# Patient Record
Sex: Male | Born: 1960 | Race: White | Hispanic: No | Marital: Single | Smoking: Current every day smoker
Health system: Southern US, Community
[De-identification: ages and names within clinical notes are randomized; demographics above are authoritative.]

## PROBLEM LIST (undated history)

## (undated) DIAGNOSIS — B1920 Unspecified viral hepatitis C without hepatic coma: Secondary | ICD-10-CM

## (undated) DIAGNOSIS — N200 Calculus of kidney: Secondary | ICD-10-CM

## (undated) DIAGNOSIS — F209 Schizophrenia, unspecified: Secondary | ICD-10-CM

## (undated) DIAGNOSIS — K8020 Calculus of gallbladder without cholecystitis without obstruction: Secondary | ICD-10-CM

## (undated) DIAGNOSIS — F3160 Bipolar disorder, current episode mixed, unspecified: Secondary | ICD-10-CM

## (undated) HISTORY — DX: Bipolar disorder, current episode mixed, unspecified: F31.60

## (undated) HISTORY — DX: Calculus of kidney: N20.0

## (undated) HISTORY — DX: Unspecified viral hepatitis C without hepatic coma: B19.20

## (undated) HISTORY — DX: Schizophrenia, unspecified: F20.9

## (undated) HISTORY — DX: Calculus of gallbladder without cholecystitis without obstruction: K80.20

---

## 2014-09-14 ENCOUNTER — Emergency Department
Admission: EM | Admit: 2014-09-14 | Discharge: 2014-09-15 | Disposition: A | Discharge status: Home / Self Care | Payer: No Typology Code available for payment source | Attending: Emergency Medicine | Admitting: Emergency Medicine

## 2014-09-14 ENCOUNTER — Emergency Department: Payer: No Typology Code available for payment source

## 2014-09-14 DIAGNOSIS — K2970 Gastritis, unspecified, without bleeding: Secondary | ICD-10-CM | POA: Insufficient documentation

## 2014-09-14 DIAGNOSIS — R319 Hematuria, unspecified: Secondary | ICD-10-CM | POA: Insufficient documentation

## 2014-09-14 DIAGNOSIS — M545 Low back pain, unspecified: Secondary | ICD-10-CM

## 2014-09-14 DIAGNOSIS — N200 Calculus of kidney: Secondary | ICD-10-CM | POA: Insufficient documentation

## 2014-09-14 DIAGNOSIS — R109 Unspecified abdominal pain: Secondary | ICD-10-CM | POA: Insufficient documentation

## 2014-09-14 DIAGNOSIS — K8020 Calculus of gallbladder without cholecystitis without obstruction: Secondary | ICD-10-CM | POA: Insufficient documentation

## 2014-09-14 LAB — VH URINALYSIS WITH MICROSCOPIC
Bilirubin, UA: NEGATIVE mg/dL
Glucose, UA: NEGATIVE mg/dL
Ketones UA: NEGATIVE mg/dL
Leukocyte Esterase, UA: NEGATIVE Leu/uL
Nitrite, UA: NEGATIVE
Protein, UR: NEGATIVE mg/dL
Urine Specific Gravity: 1.02 (ref 1.001–1.040)
Urobilinogen, UA: 2 mg/dL — AB
WBC, UA: NONE SEEN /hpf
pH, Urine: 7 pH (ref 5.0–8.0)

## 2014-09-14 LAB — COMPREHENSIVE METABOLIC PANEL
ALT: 245 U/L — ABNORMAL HIGH (ref 0–55)
AST (SGOT): 140 U/L — ABNORMAL HIGH (ref 10–42)
Albumin/Globulin Ratio: 0.9 Ratio (ref 0.70–1.50)
Albumin: 3.8 gm/dL (ref 3.5–5.0)
Alkaline Phosphatase: 73 U/L (ref 40–145)
Anion Gap: 13.3 mMol/L (ref 7.0–18.0)
BUN / Creatinine Ratio: 16.5 Ratio (ref 10.0–30.0)
BUN: 14 mg/dL (ref 7–22)
Bilirubin, Total: 0.6 mg/dL (ref 0.1–1.2)
CO2: 24 mMol/L (ref 20.0–30.0)
Calcium: 9.7 mg/dL (ref 8.5–10.5)
Chloride: 108 mMol/L (ref 98–110)
Creatinine: 0.85 mg/dL (ref 0.80–1.30)
EGFR: >60 mL/min/{1.73_m2}
Globulin: 4.3 gm/dL — ABNORMAL HIGH (ref 2.0–4.0)
Glucose: 90 mg/dL (ref 70–99)
Osmolality Calc: 280 mOsm/kg (ref 275–300)
Potassium: 4.9 mMol/L (ref 3.5–5.3)
Protein, Total: 8.1 gm/dL (ref 6.0–8.3)
Sodium: 140 mMol/L (ref 136–147)

## 2014-09-14 LAB — CBC AND DIFFERENTIAL
Basophils %: 1 % (ref 0.0–3.0)
Basophils Absolute: 0 10*3/uL (ref 0.0–0.3)
Eosinophils %: 3.7 % (ref 0.0–7.0)
Eosinophils Absolute: 0.1 10*3/uL (ref 0.0–0.8)
Hematocrit: 39.9 % (ref 39.0–52.5)
Hemoglobin: 14.2 gm/dL (ref 13.0–17.5)
Lymphocytes Absolute: 1 10*3/uL (ref 0.6–5.1)
Lymphocytes: 27.1 % (ref 15.0–46.0)
MCH: 36 pg — ABNORMAL HIGH (ref 28–35)
MCHC: 36 gm/dL (ref 31–36)
MCV: 101 fL — ABNORMAL HIGH (ref 80–100)
MPV: 7 fL (ref 6.0–10.0)
Monocytes Absolute: 0.5 10*3/uL (ref 0.1–1.7)
Monocytes: 12.9 % (ref 3.0–15.0)
Neutrophils %: 55.3 % (ref 42.0–78.0)
Neutrophils Absolute: 2.1 10*3/uL (ref 1.7–8.6)
PLT CT: 119 10*3/uL — ABNORMAL LOW (ref 130–440)
RBC: 3.97 10*6/uL — ABNORMAL LOW (ref 4.00–5.70)
RDW: 11.3 % (ref 10.5–14.5)
WBC: 3.8 10*3/uL — ABNORMAL LOW (ref 4.00–11.00)

## 2014-09-14 LAB — LIPASE: Lipase: 24 U/L (ref 8–78)

## 2014-09-14 MED ORDER — FAMOTIDINE 20 MG PO TABS
ORAL_TABLET | ORAL | Status: AC
Start: 2014-09-14 — End: ?
  Filled 2014-09-14: qty 1

## 2014-09-14 MED ORDER — IOPAMIDOL 61 % IV SOLN
100.0000 mL | Freq: Once | INTRAVENOUS | Status: AC
Start: 2014-09-14 — End: 2014-09-14
  Administered 2014-09-14: 100 mL via INTRAVENOUS

## 2014-09-14 MED ORDER — OXYCODONE HCL 5 MG PO TABS
5.0000 mg | ORAL_TABLET | ORAL | Status: DC | PRN
Start: 2014-09-14 — End: 2017-05-09

## 2014-09-14 MED ORDER — MORPHINE SULFATE 4 MG/ML IJ/IV SOLN (WRAP)
4.0000 mg | Freq: Once | Status: AC
Start: 2014-09-14 — End: 2014-09-14
  Administered 2014-09-14: 4 mg via INTRAVENOUS

## 2014-09-14 MED ORDER — FAMOTIDINE 20 MG PO TABS
20.0000 mg | ORAL_TABLET | Freq: Once | ORAL | Status: AC
Start: 2014-09-14 — End: 2014-09-14
  Administered 2014-09-14: 20 mg via ORAL

## 2014-09-14 MED ORDER — SODIUM CHLORIDE 0.9 % IV SOLN
INTRAVENOUS | Status: DC
Start: 2014-09-14 — End: 2014-09-15

## 2014-09-14 MED ORDER — MORPHINE SULFATE (PF) 4 MG/ML IV SOLN
INTRAVENOUS | Status: AC
Start: 2014-09-14 — End: ?
  Filled 2014-09-14: qty 1

## 2014-09-14 NOTE — ED Notes (Signed)
Called Marvin Khan in lab and they are aware of added urinalysis.

## 2014-09-14 NOTE — ED Provider Notes (Signed)
Physician/Midlevel provider first contact with patient: 09/14/14 2111         [facility] EMERGENCY DEPARTMENT History and Physical Exam      Patient Name: Marvin, Khan  Encounter Date:  09/14/2014  Attending Physician: Justice Britain, MD  PCP: Christa See, MD  Patient DOB:  11/09/1960  MRN:  [id number]  Room:  E3/ED3-A      History of Presenting Illness     Chief complaint: Back Pain    HPI/ROS is limited by: none  HPI/ROS given by: patient    Location: mid low back  Duration: since yesterday  Severity: moderate    [name] is a 53 y.o. male who presents with mid low back pain and mid abdominal pain.  Pt notes he has back pain off and on.  He notes he just had upper and lower endoscopy, was told he need 3 medications for an infection in his stomach but cant afford rx.  He states they told him in an ER he had cirrhosis but then the specialist told him he doesn't have it.  He does state he has hep C.  He notes h/o gallstones and h/o kidney stones      Review of Systems     Review of Systems   Constitutional: Negative for fever.   Cardiovascular: Negative for chest pain.   Gastrointestinal: Positive for abdominal pain.   Genitourinary: Negative for dysuria.   Musculoskeletal: Positive for back pain.   Neurological: Negative for focal weakness.   All other systems reviewed and are negative.       Allergies     Pt is allergic to toradol.    Medications     Current Outpatient Rx   Name  Route  Sig  Dispense  Refill   . oxyCODONE (ROXICODONE) 5 MG immediate release tablet    Oral    Take 1-2 tablets (5-10 mg total) by mouth every 4 (four) hours as needed for Pain.    12 tablet    0     . QUEtiapine (SEROQUEL) 200 MG tablet    Oral    Take 200 mg by mouth 2 (two) times daily.                  Past Medical History     Pt has a past medical history of Calculus of kidney; Gallstones; Bipolar 1 disorder, mixed; Schizophrenia; and Hepatitis C.    Past Surgical History     Pt has past surgical history that includes  RHINOPLASTY, SEPTOPLASTY, LEVEL 3 (MEDICAL).    Family History     The family history is not on file.    Social History     Pt reports that he has been smoking Cigarettes.  He has been smoking about 1.00 pack per day. He has never used smokeless tobacco. He reports that he drinks about 7.2 oz of alcohol per week. He reports that he uses illicit drugs (Marijuana).    Physical Exam     Blood pressure 158/110, pulse 72, temperature 98.3 F (36.8 C), temperature source Oral, resp. rate 20, height 1.702 m, weight 94.348 kg, SpO2 98 %.    Constitutional: Vital signs reviewed. Well appearing.  Head: Normocephalic, atraumatic  Eyes: Conjunctiva and sclera are normal.  No injection or discharge.  Ears, Nose, Throat:  Normal external examination of the nose and ears.    Neck: Normal range of motion. Trachea midline.  Respiratory/Chest: Clear to auscultation. No respiratory distress.   Cardiovascular: Regular  rate and rhythm.  Abdomen:  No rebound or guarding. Soft.  Somewhat distended, no tympany.  Mild mid and epigastric TTP  Back: no cva tenderness.  No lumbar TTP   Upper Extremity:  No edema. No cyanosis.  Lower Extremity:  No edema. No cyanosis.  Skin: Warm and dry. No rash.  Psychiatric:  Normal affect.  Normal insight  Neuro: alert and oriented     Orders Placed     Orders Placed This Encounter   Procedures   . CT Abdomen Pelvis with IV Cont   . CMP   . CBC   . Lipase   . Urinalysis with Microscopic   . Cardiac Monitoring (Hard Wire)   . Saline lock IV       Diagnostic Results       The results of the diagnostic studies below have been reviewed by myself:    Labs  Results     Procedure Component Value Units Date/Time    Urinalysis with Microscopic [[id number]]  (Abnormal) Collected:  09/14/14 2130    Specimen Information:  Urine, Random Updated:  09/14/14 2315     Color, UA Yellow      Clarity, UA Clear      Specific Gravity, UR 1.020      pH, Urine 7.0 pH      Protein, UR Negative mg/dL      Glucose, UA Negative mg/dL       Ketones UA Negative mg/dL      Bilirubin, UA Negative mg/dL      Blood, UA Large (A) mg/dL      Nitrite, UA Negative      Urobilinogen, UA 2.0 (A) mg/dL      Leukocyte Esterase, UA Negative Leu/uL      UR Micro Performed      WBC, UA None Seen /hpf      RBC, UA 20-30 (A) /hpf      Bacteria, UA None /hpf     CMP [[id number]]  (Abnormal) Collected:  09/14/14 2155    Specimen Information:  Plasma Updated:  09/14/14 2227     Sodium 140 mMol/L      Potassium 4.9 mMol/L      Chloride 108 mMol/L      CO2 24.0 mMol/L      Calcium 9.7 mg/dL      Glucose 90 mg/dL      Creatinine 9.32 mg/dL      BUN 14 mg/dL      Protein, Total 8.1 gm/dL      Albumin 3.8 gm/dL      Alkaline Phosphatase 73 U/L      ALT 245 (H) U/L      AST (SGOT) 140 (H) U/L      Bilirubin, Total 0.6 mg/dL      Albumin/Globulin Ratio 0.90 Ratio      Anion Gap 13.3 mMol/L      BUN/Creatinine Ratio 16.5 Ratio      EGFR >60 mL/min/1.87m2      Osmolality Calc 280 mOsm/kg      Globulin 4.3 (H) gm/dL     Lipase [[id number]] Collected:  09/14/14 2155    Specimen Information:  Plasma Updated:  09/14/14 2227     Lipase 24 U/L     CBC [[id number]]  (Abnormal) Collected:  09/14/14 2155    Specimen Information:  Blood from Blood Updated:  09/14/14 2222     WBC 3.8 (L) K/cmm      RBC 3.97 (L) M/cmm  Hemoglobin 14.2 gm/dL      Hematocrit 91.4 %      MCV 101 (H) fL      MCH 36 (H) pg      MCHC 36 gm/dL      RDW 78.2 %      PLT CT 119 (L) K/cmm      MPV 7.0 fL      NEUTROPHIL % 55.3 %      Lymphocytes 27.1 %      Monocytes 12.9 %      Eosinophils % 3.7 %      Basophils % 1.0 %      Neutrophils Absolute 2.1 K/cmm      Lymphocytes Absolute 1.0 K/cmm      Monocytes Absolute 0.5 K/cmm      Eosinophils Absolute 0.1 K/cmm      BASO Absolute 0.0 K/cmm           Radiologic Studies  Radiology Results (24 Hour)     Procedure Component Value Units Date/Time    CT Abdomen Pelvis with IV Cont [[id number]] Collected:  09/14/14 2250    Order Status:  Completed Updated:  09/14/14 2258     Narrative:      Clinical History:  Abdominal and back pain with history of kidney stones and cholelithiasis.    Technique:  CT of abdomen and pelvis with intravenous contrast obtained. Multiplanar reconstructions performed.  CT images were acquired utilizing Automated Exposure Control for dose reduction.     Contrast:  100 cc of Isovue-300    Comparison:  None available.    Findings:    Lower chest:  The lung bases are clear. No pleural effusions.    Abdomen/Pelvis:    Nodular cirrhotic liver morphology. No focal hepatic lesion seen. Nondistended gallbladder with radiopaque  cholelithiasis at the neck region evident. No biliary dilatation.    The spleen, pancreas, and adrenal glands are unremarkable. Mildly prominent portohepatic and portacaval lymph nodes are  presumably reactive to hepatic disease.    Corticomedullary phase of renal enhancement limits evaluation. There is mild lobulated contour scarring. A couple tiny  right renal hypodense lesions are too small to definitively characterize but probably cysts.    Nonobstructing left nephrolithiasis with a cluster of the largest stones in the lower pole measuring about 4 mm. There  are subtle punctate nonobstructing right renal calculi to a lesser extent. No hydronephrosis or distal urolithiasis.    Normal course and caliber of small and large bowel without obstruction or acute inflammation. Normal appendix.  No pneumoperitoneum.    The urinary bladder is unremarkable without wall thickening, calcification, or intraluminal calculus.     Normal prostate size. No aggressive osseous lesions.      Impression:        1. Nephrolithiasis without evidence of obstruction. No hydronephrosis.    2. Cholelithiasis. No evidence of acute cholecystitis or biliary dilatation.    3. Nodular cirrhotic liver morphology. Correlate with LFTs.          ReadingStation:[id number]          EKG: none      MDM / Critical Care     Blood pressure 158/110, pulse 72, temperature 98.3 F (36.8 C),  temperature source Oral, resp. rate 20, height 1.702 m, weight 94.348 kg, SpO2 98 %.    DDX includes aortic dissection, gastritis and msk low back pain, DDD, cholecystitis among others.    ED Course     CT viewed by myself  only notable for known existing pathology.  Findings and hematuria and f/u d/w pt.  Pt encouraged to fill rx from his gastroenterologist, provided number for social worker.  If not able to accomplish that pt encouraged to take PPI or pepcid.  Standard and appropriate return to ED precautions given to which patient verbalized understanding.  All questions answered and concerns addressed.       Procedures         Diagnosis / Disposition     Clinical Impression  1. Gastritis    2. Calculus of gallbladder without cholecystitis without obstruction    3. Nephrolithiasis    4. Hematuria    5. Midline low back pain without sciatica        Disposition  ED Disposition     Discharge Louise Gergely discharge to home/self care.    Condition at disposition: Stable            Prescriptions  Discharge Medication List as of 09/15/2014 12:05 AM      START taking these medications    Details   oxyCODONE (ROXICODONE) 5 MG immediate release tablet Take 1-2 tablets (5-10 mg total) by mouth every 4 (four) hours as needed for Pain., Starting 09/14/2014, Until Discontinued, Print                         Justice Britain, MD  09/15/14 [phone]
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
## 2014-09-15 NOTE — Discharge Instructions (Signed)
Gastritis (Adult)    Gastritis is an irritation of the stomach lining. It can be acute (recent) or chronic (lasting a long time). Gastritis can be caused by overuse of alcohol or anti-inflammatory medications (such as aspirin, ibuprofen, or prednisone). H pyloriinfection can also cause chronic gastritis.  Gastritis can cause a dull ache or burning pain in the upper abdomen. Other symptoms include nausea, vomiting, loss of appetite, and belching or bloating. Blood in the vomit or stools (red or black) is a sign of bleeding in the stomach. This requires immediate medical attention.  Tests for H pyloriare used to screen for bacterial infection. If no infection is found, gastritis can be treated by stopping the cause and treating with antacids plus an acid blocker medication. If H pylori infection is found, antibiotics will also be prescribed. Persons 55 years and older may undergo other tests before treatment is started.  Two common tests are used to evaluate your symptoms. An upper GI series is an x-ray taken after you drink a chalky liquid called barium. This coats the stomach and allows the doctor to view any problems in the stomach on the x-ray. Another test is called endoscopy, during which a long thin tube called an endoscope is passed down your throat to the stomach. A camera at the end of the scope allows the doctor to view inside the stomach to check the cause of your symptoms.  Home Care:   Take the prescribed acid blocker medication for the full course of treatment even if you begin to feel better sooner. This medication can take up to several days to fully control your symptoms. If you can't afford the prescribed medication, you can try over-the-counter acid blockers, such as Pepcid AC, Tagamet, Zantac, or Aciphex. If these do not relieve your symptoms, a stronger acid-blocker can be tried, such as Prilosec OTC.   If you have been prescribed an antibiotic to treat H pyloriinfection, finish the full  course of medication. Do so even if you begin to feel better sooner. If you stop the medication too soon, the infection can return and be harder to treat.   You can use antacids, such as Tums, Rolaids, Mylanta, or Maalox, for pain. This will be useful the first few days after starting acid blockers when the blockers haven't started working yet. Follow the directions on the label. Liquid antacids may work better than tablets. Note that antacids can interfere with absorption of certain medications. Specifically, do not take Tagamet (cimetidine), Zantac (ranitidine), or Carafate (sucralfate) within 1 hour of taking an antacid. Talk with your pharmacist if you have any questions.   Symptoms of gastritis can be worsened by certain foods. Limit or avoid fatty, fried, and spicy foods, as well as coffee, chocolate, mint, and foods with high acid content such as tomatoes and citrus fruit and juices (orange, grapefruit, lemon).   Avoid alcohol, caffeine, and tobacco, which can delay healing.   Avoid aspirin and anti-inflammatory medications such as ibuprofen (Advil, Motrin) and naproxen (Naprosyn, Aleve). Acetaminophen (Tylenol) is safe to use. Do not take more than the amount listed on the label.  Follow Up  with your doctor, or as advised by our staff. Further testing may be needed. If you do not improve over the next 4 days, contact your doctor. If you had an x-ray, CT scan, or ECG (electrocardiogram), it will be reviewed by a specialist. You'll be notified of any new findings that affect your care.  Get Prompt Medical Attention  if   any of the following occur:   Stomach pain gets worse or moves to the lower right abdomen (appendix area)   Chest pain appears or gets worse, or spreads to the back, neck, shoulder, or arm   Frequent vomiting (can't keep down liquids)   Blood in the stool or vomit (red or black in color)   Feeling weak or dizzy, fainting, or trouble breathing   Fever of 100.4F (38C) or higher, or as  directed by your healthcare provider   2000-2015 [employer] [address], [place] [address]. All rights reserved. This information is not intended as a substitute for professional medical care. Always follow your healthcare professional's instructions.

## 2020-05-17 IMAGING — MR MRI ABDOMEN WO/W CONTRAST
6 of 12 series · 15 of 48 positions shown · IV contrast (20cc prohance)
Comparison: none

[Series 2: bSSFP · axial · 8.0mm · 1.76mm/px · 1 of 22 slices shown]
[im 1/22]
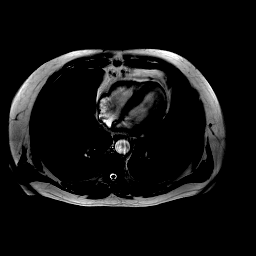

[Series 3: t2_cor_haste · coronal · 8.0mm · 0.84mm/px · 1 of 24 slices shown]
[im 1/24]
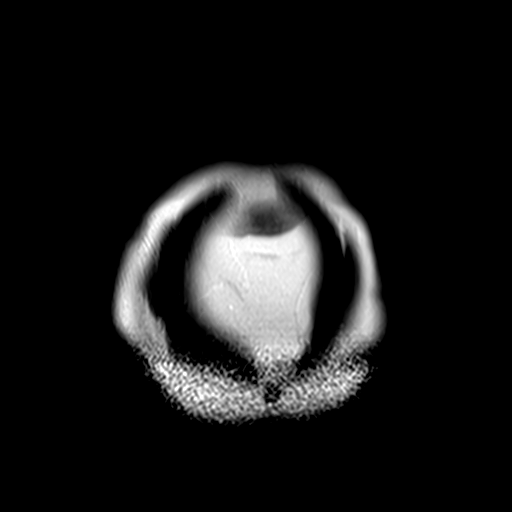

[Series 4: t2_axial_fs · axial · 7.0mm · 1.76mm/px · 1 of 28 slices shown]
[im 1/28]
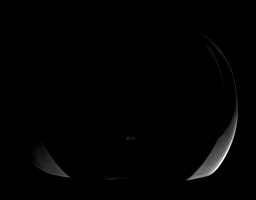

[Series 5: t1_vibe_(person_name)_axial_fs_in · axial · 4.0mm · 0.70mm/px · z∈[-187,+49]mm · 5 of 60 slices shown]
[im 1/60]
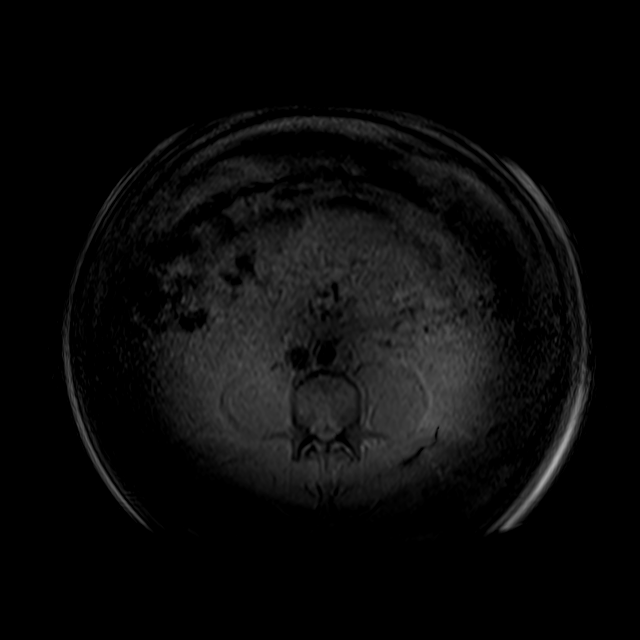
[im 15/60]
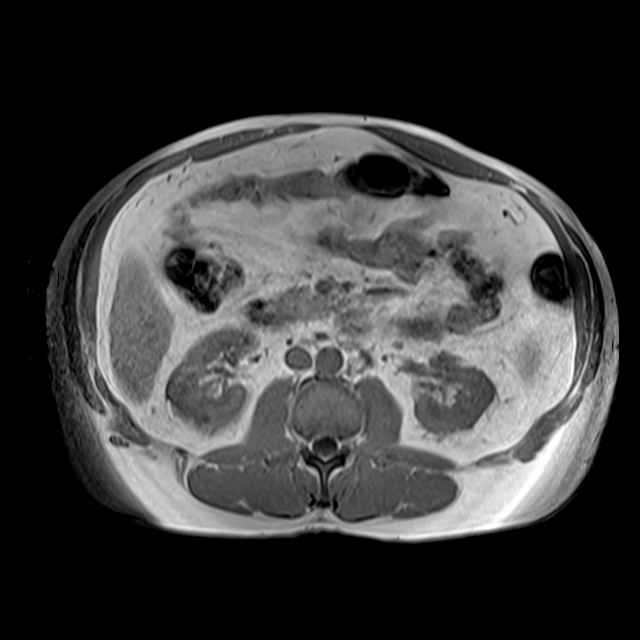
[im 30/60]
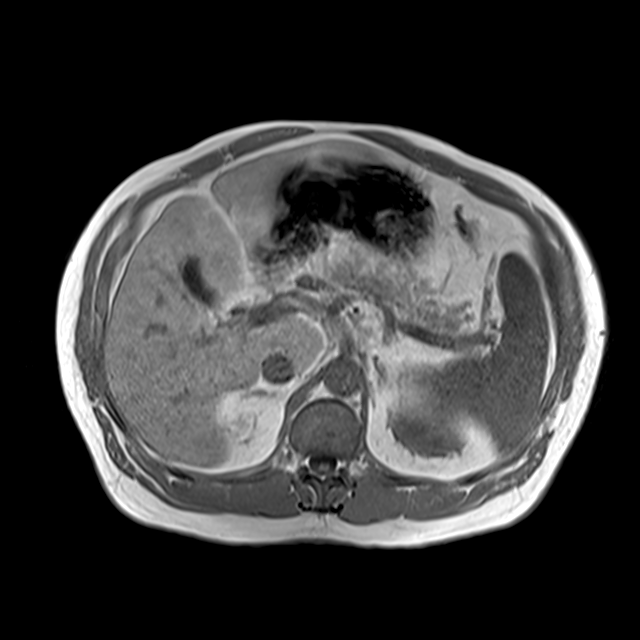
[im 45/60]
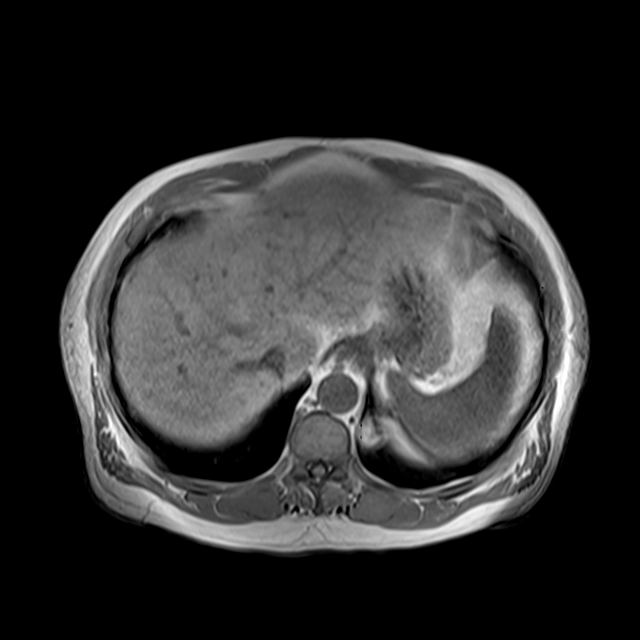
[im 60/60]
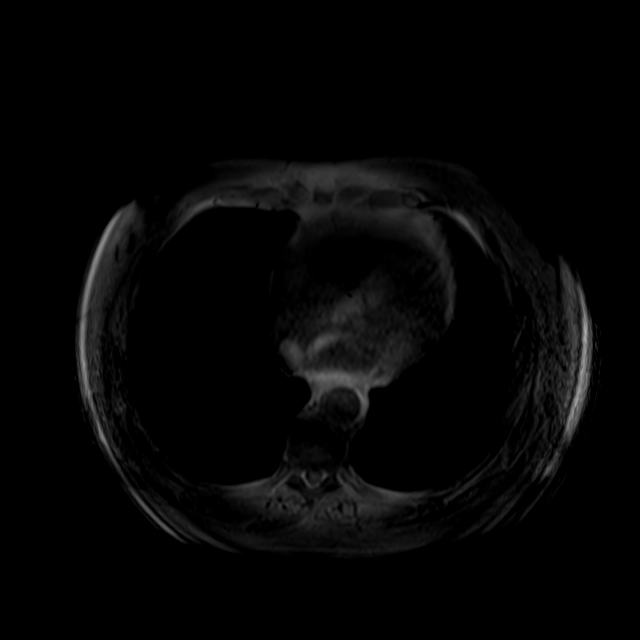

[Series 6: t1_vibe_(person_name)_axial_fs_opp · axial · 4.0mm · 0.70mm/px · z∈[-187,+49]mm · 5 of 60 slices shown]
[im 1/60]
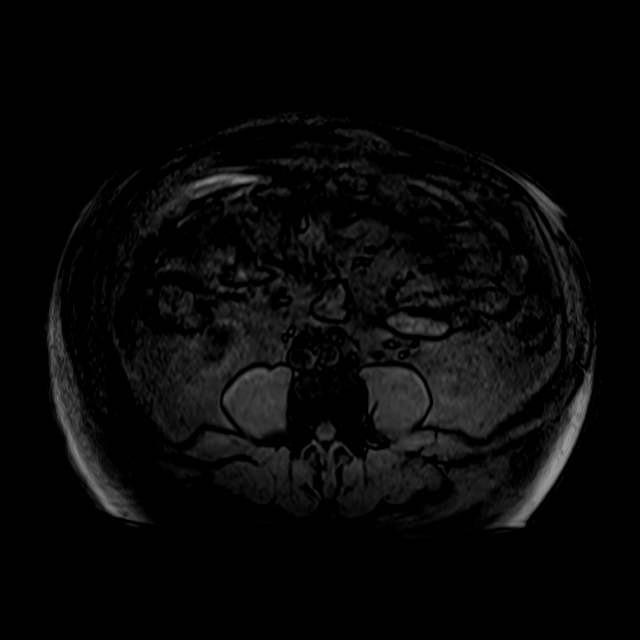
[im 15/60]
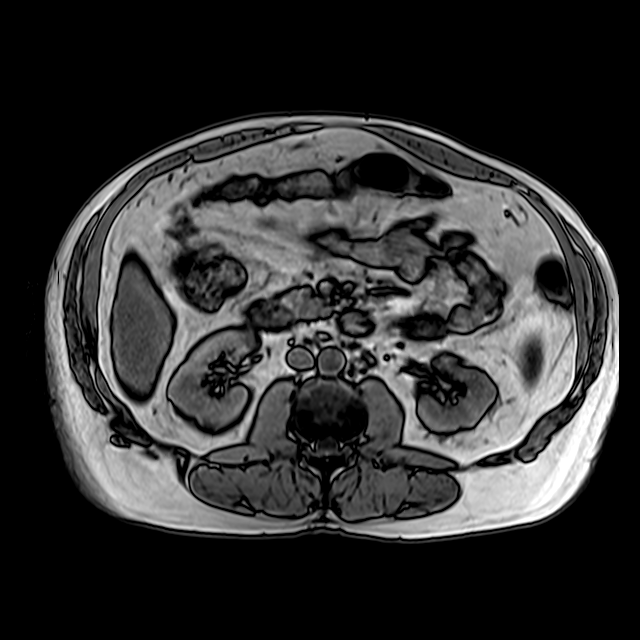
[im 30/60]
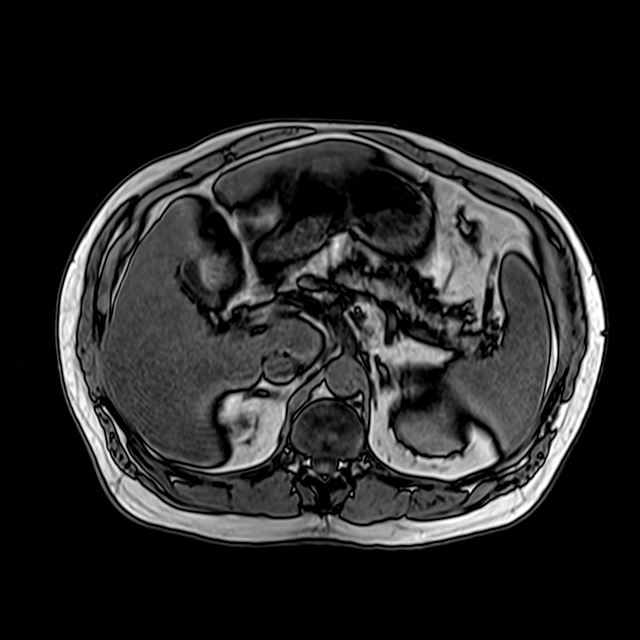
[im 45/60]
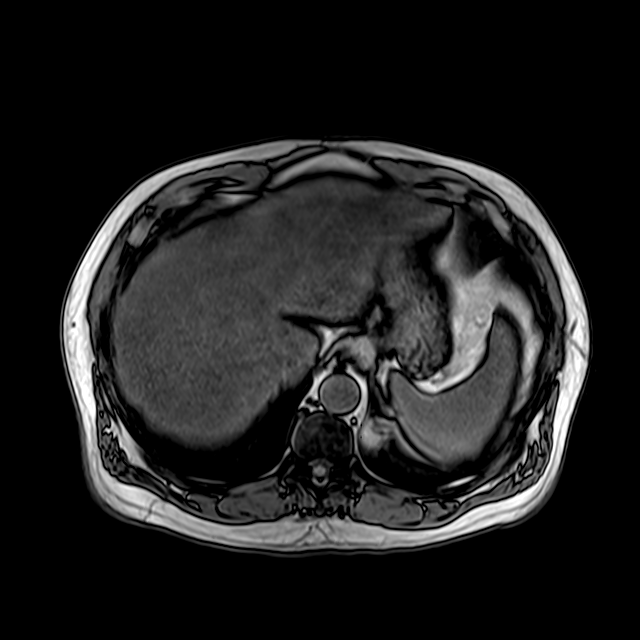
[im 60/60]
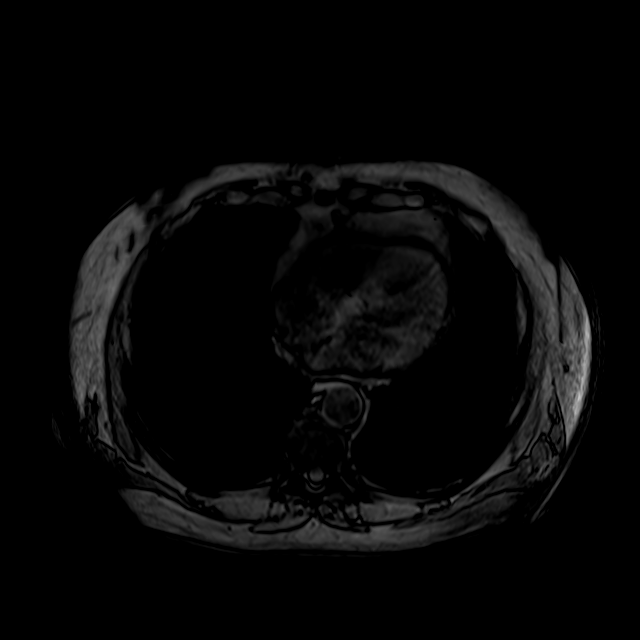

[Series 8: t1_vibe_(person_name)_axial_fs_w · axial · 4.0mm · 0.70mm/px · z∈[-187,-131]mm · 2 of 60 slices shown]
[im 1/60]
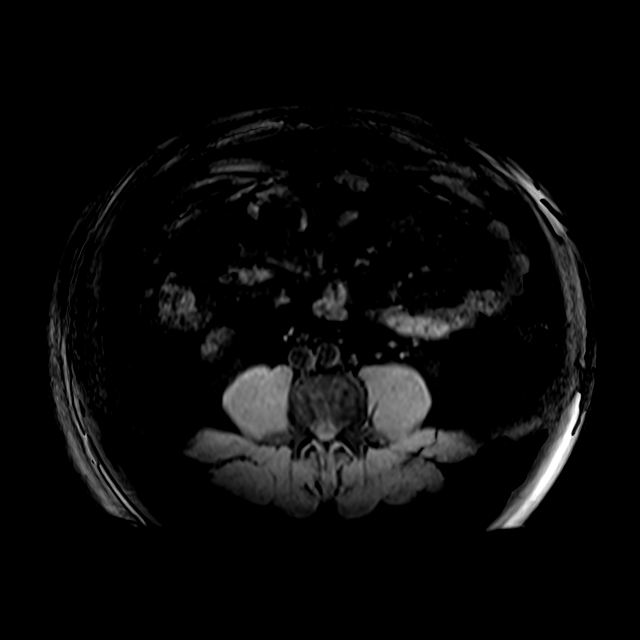
[im 15/60]
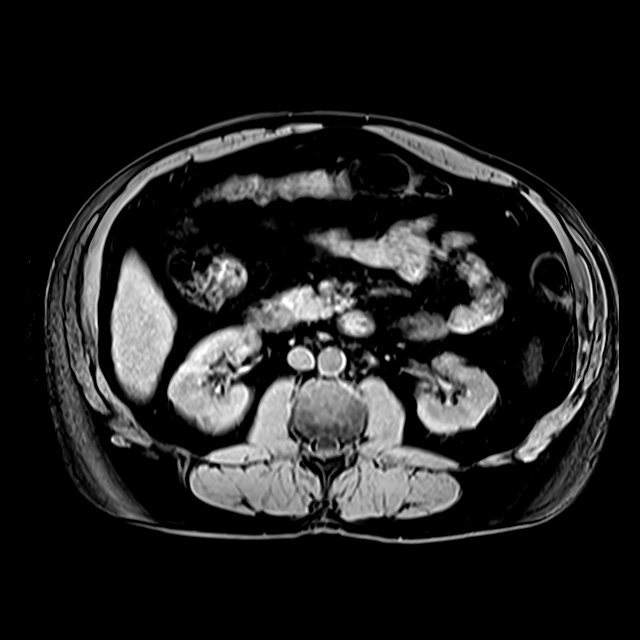

[15 of 48 positions shown; findings below may reference images not displayed]

REASON FOR EXAM

*
Cirrhosis of liver, history of liver masses

COMPARISON

*
None

TECHNIQUE

*
MR images of the abdomen were acquired before and after the administration of 20 mL ProHance IV contrast

FINDINGS

Liver Observation #1

*
Subcapsular segment V   ([DATE])

*
1.2 cm (not well-defined)

*
Subtle arterial hyperenhancement (subtracted images)

*
Negative for washout

*
Lack of enhancing capsule

*
Fat sparing

*
LR-3

Liver

*
Subtle nodularity

*
Steatosis

*
Minimal subcapsular shunting about the liver on the arterial phase

Vasculature

*
Standard hepatic arterial anatomy

*
Patent hepatic veins

*
Patent portal vein, splenic vein, SMV

Biliary System

*
Contracted gallbladder

*
Cholelithiasis

*
No biliary dilatation

Extrahepatic Manifestations of Cirrhosis

*
Tiny collateral veins

*
No ascites

*
Mild splenomegaly

Additional

*
Punctate renal cysts

*
No suspicious lymph nodes

*
No malignant osseous enhancement

IMPRESSION

*
Hepatic segment V lesion is intermediate in probability of malignancy (LR-3). Consider follow-up MR abdomen (without and with IV contrast) in 3-6 months

*
Cirrhosis

*
Cholelithiasis

## 2020-09-06 IMAGING — MR MRI ABDOMEN WO/W CONTRAST
5 of 9 series · 17 of 48 positions shown · IV contrast (20cc ProHance)
Comparison: none

[Series 1: bSSFP · axial · 8.0mm · 1.76mm/px · 1 of 22 slices shown]
[im 1/22]
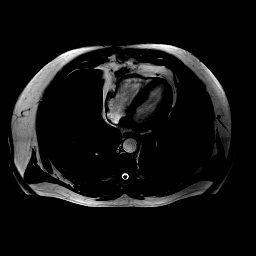

[Series 2: t2_cor_haste · coronal · 6.0mm · 0.73mm/px · 2 of 24 slices shown]
[im 1/24]
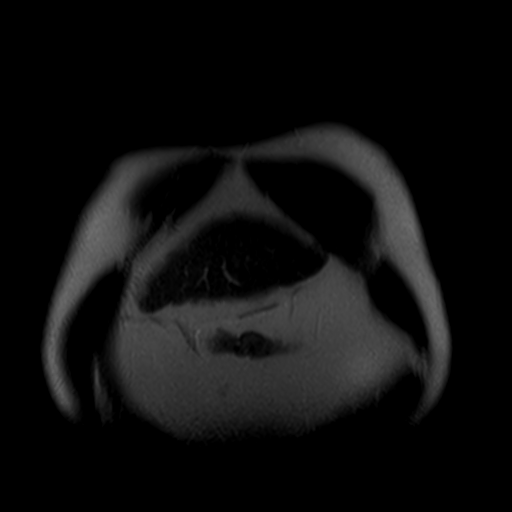
[im 24/24]
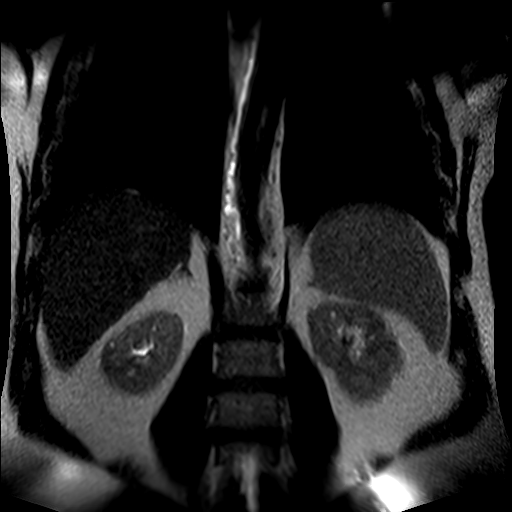

[Series 3: t2_axial_fs · axial · 6.0mm · 1.64mm/px · z∈[-163,+46]mm · 3 of 30 slices shown]
[im 1/30]
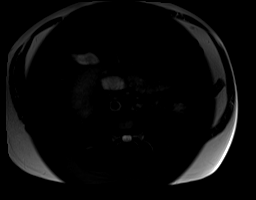
[im 15/30]
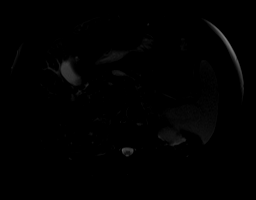
[im 30/30]
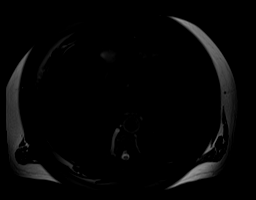

[Series 4: t1_vibe_(person_name)_axial_fs_in · axial · 3.3mm · 0.59mm/px · z∈[-163,+45]mm · 7 of 64 slices shown]
[im 1/64]
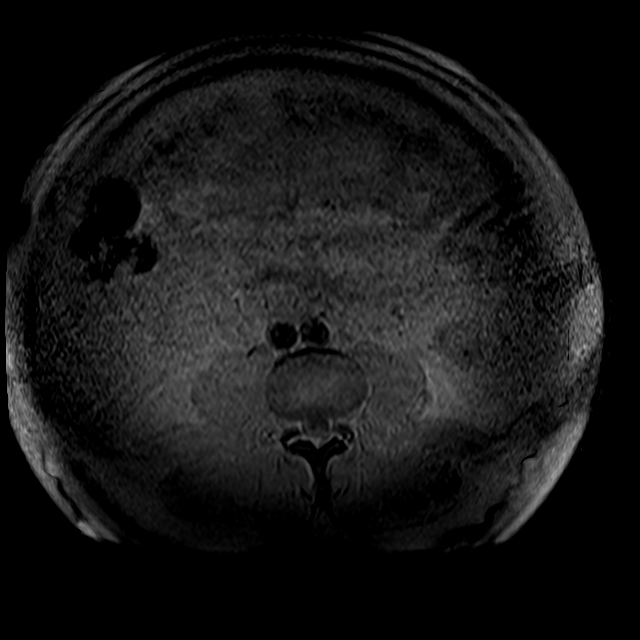
[im 11/64]
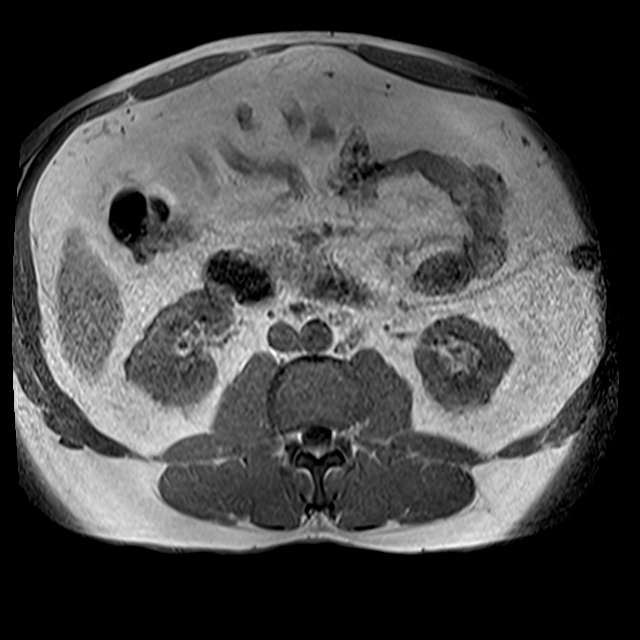
[im 22/64]
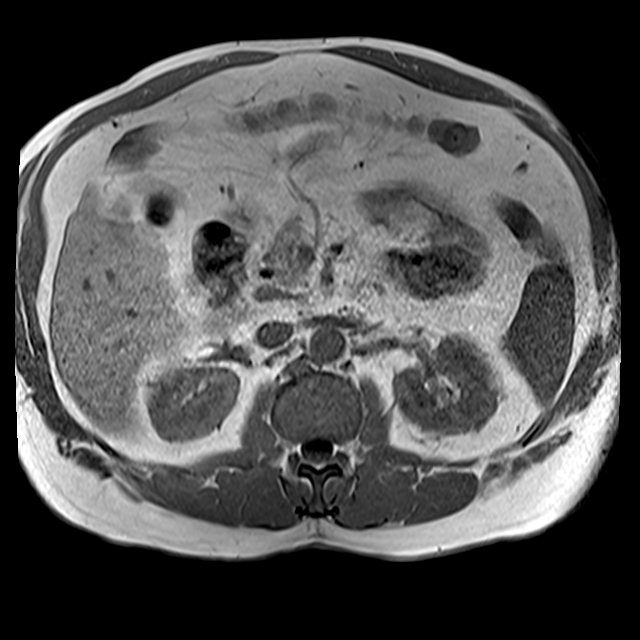
[im 32/64]
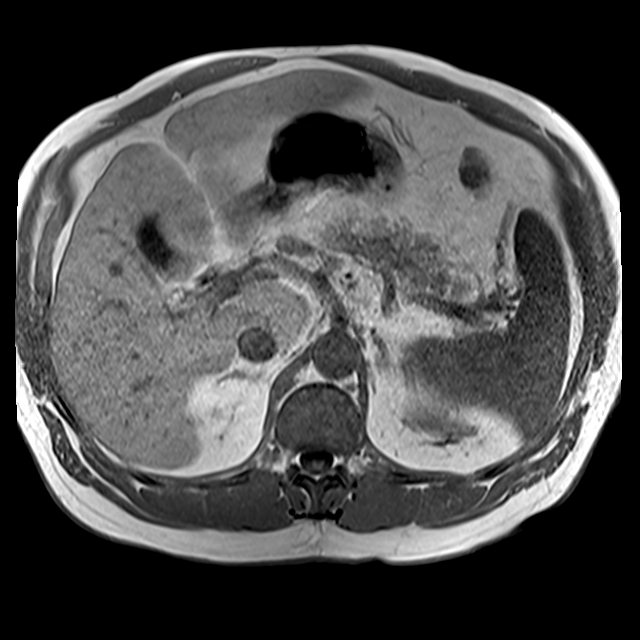
[im 43/64]
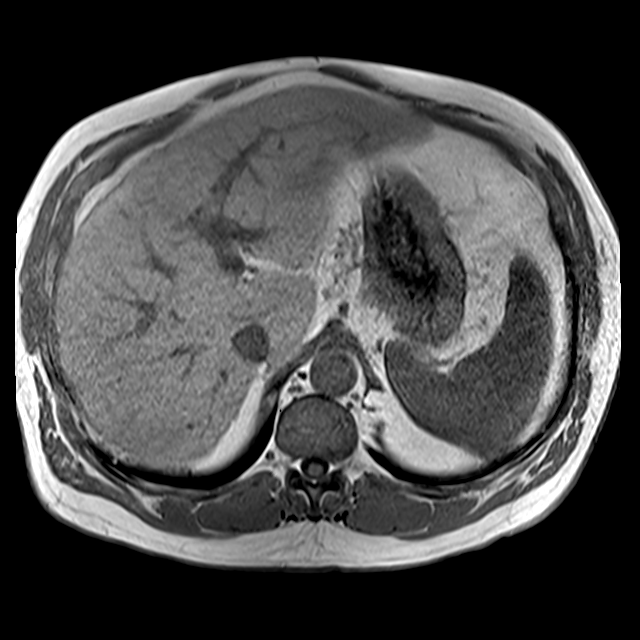
[im 53/64]
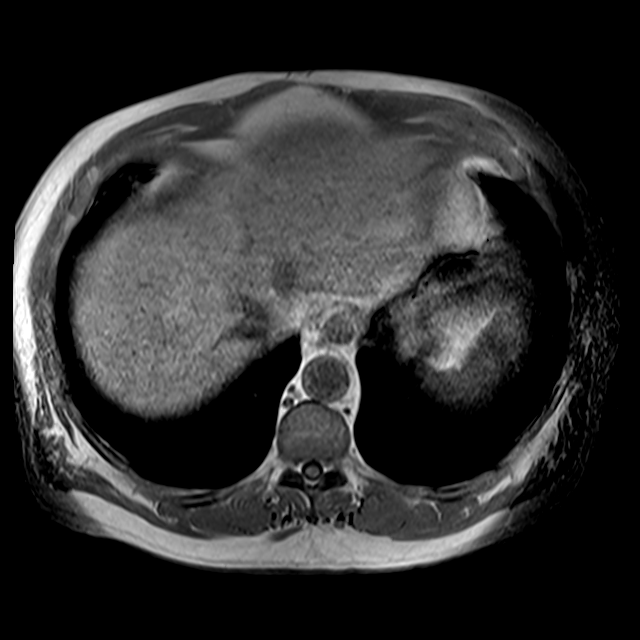
[im 64/64]
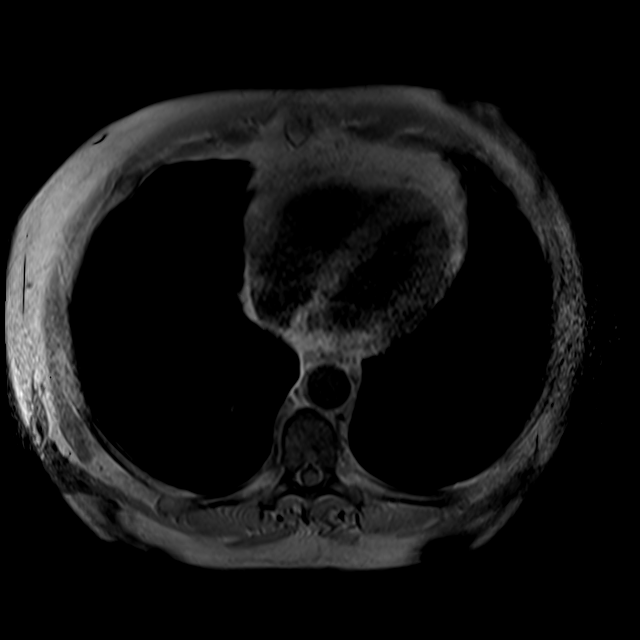

[Series 5: t1_vibe_(person_name)_axial_fs_opp · axial · 3.3mm · 0.59mm/px · z∈[-163,-60]mm · 4 of 64 slices shown]
[im 1/64]
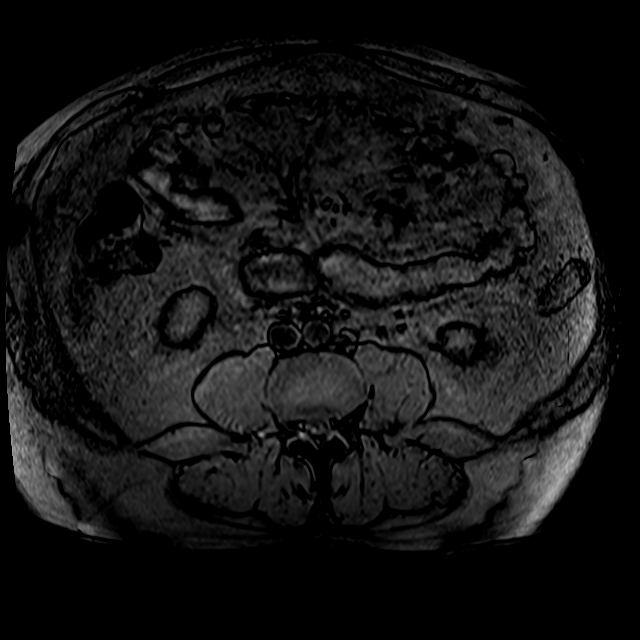
[im 11/64]
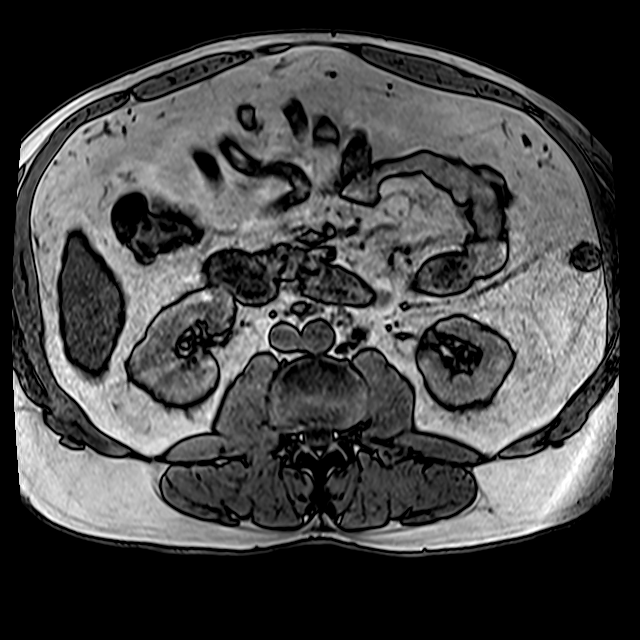
[im 22/64]
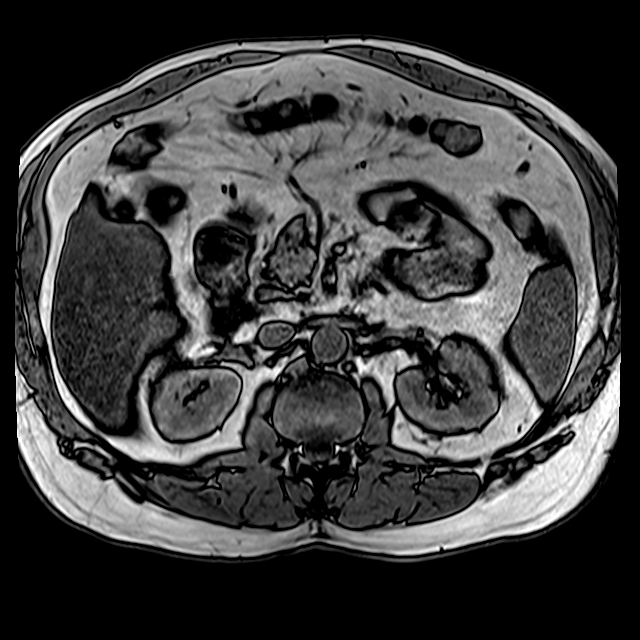
[im 32/64]
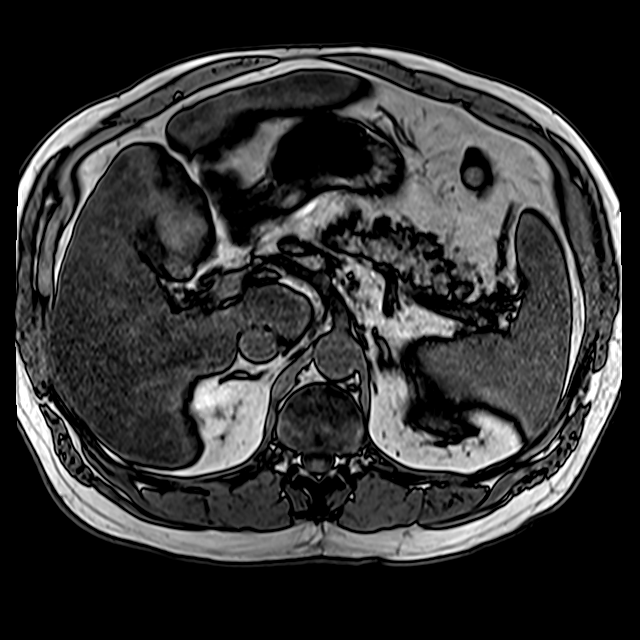

[17 of 48 positions shown; findings below may reference images not displayed]

REASON FOR EXAM

*
Cirrhosis

*
Thoracic aortic aneurysm, without rupture

*
Neoplasm of unspecified behavior of digestive system

COMPARISON

*
MR abdomen 05/17/2020

TECHNIQUE

*
MR images of the abdomen were acquired before and after the administration of 20 mL ProHance IV contrast

FINDINGS

Liver Observation #1

*
Subcapsular segment V   ([DATE])

*
Not well-defined (limited measurement)

*
1.4 cm   --->   1.3 cm

*
Arterial hyperenhancement

*
Negative for washout

*
Lack of enhancing capsule

*
Fatty sparing

*
LR-3

Liver Observation #2

*
Subcapsular segment VIII   ([DATE])

*
0.9 cm   --->   0.8 cm

*
Arterial hyperenhancement more conspicuous on today's exam

*
Negative for washout

*
Equivocal for enhancing capsule

*
Fatty sparing

*
LR-3

Liver

*
Nodular

*
Severe steatosis

Vasculature

*
Standard hepatic arterial anatomy

*
Patent hepatic veins

*
Patent portal vein, splenic vein, SMV

Extrahepatic Manifestations of Cirrhosis

*
No significant collateral veins

*
No ascites

*
Spleen size is normal

Biliary and Pancreas

*
Cholelithiasis

*
No biliary dilatation

Kidneys and Adrenals

*
Punctate renal cysts

Lymph Nodes

*
No suspicious lymph nodes

Additional

*
No malignant osseous enhancement

IMPRESSION

*
Hepatic lesions are similar to the 05/17/2020 exam (LR-3). Recommend follow-up MR abdomen (without and with IV contrast) in 6 months

## 2020-09-29 IMAGING — CT CTA CHEST WO-W CONTRAST
3 of 8 series · 5 of 16 positions shown, 6 images · non-contrast
Comparison: None

HISTORY: Thoracic aortic aneurysm
TECHNIQUE: Axial CT images were obtained to the chest pre- and post 100 mL of Ssovue-WPY. Serum creatinine is 0.9. CT angiogram protocol was employed with source and 3-D reconstructed images generated and used for interpretation performed under concurrent physician supervision.

[Series 7: angio · axial · 0.71mm/px · z∈[-1232,-1044]mm · 3 of 378 slices shown, 4 images]
[im 95/378  soft-tissue]
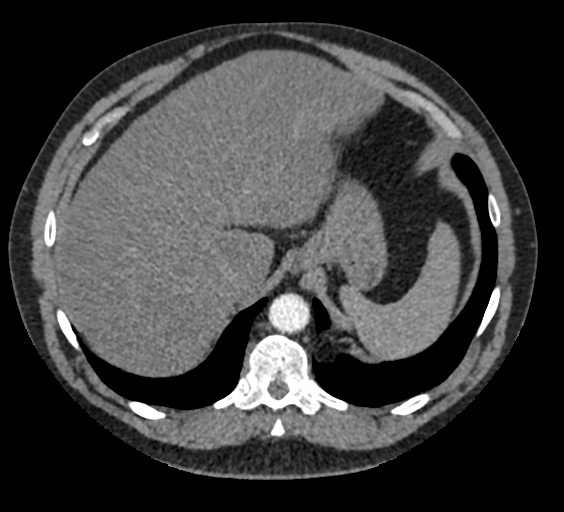
[im 95/378  bone]
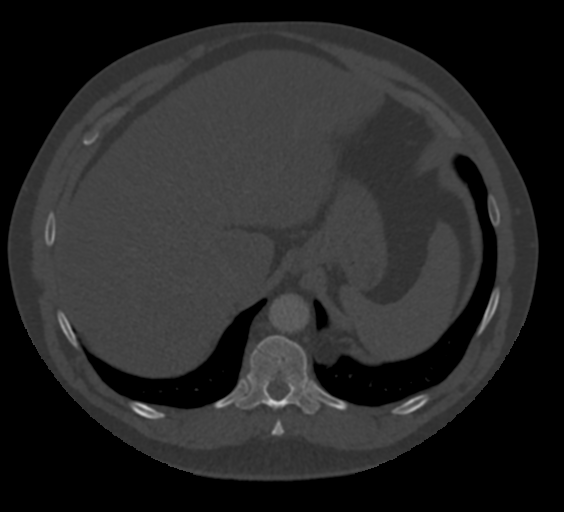
[im 189/378  soft-tissue]
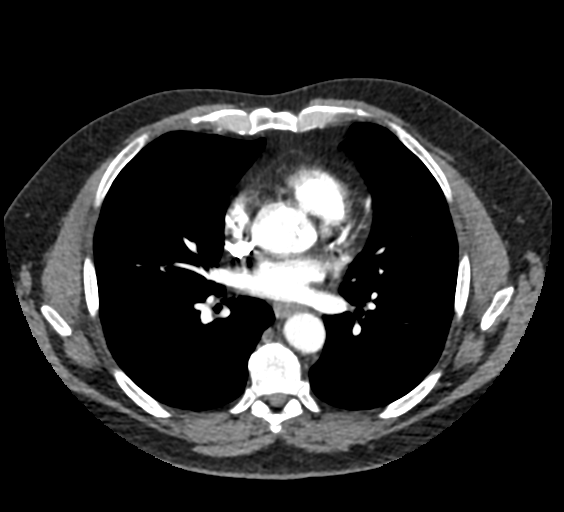
[im 283/378  soft-tissue]
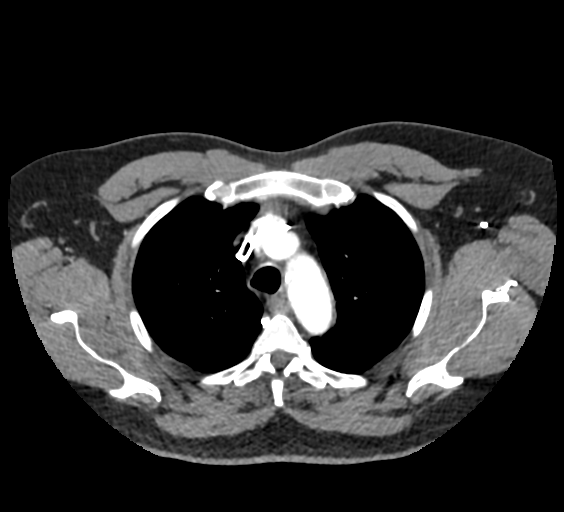

[Series 11: coronal · coronal · 0.74mm/px · 1 of 181 slices shown]
[im 91/181  soft-tissue]
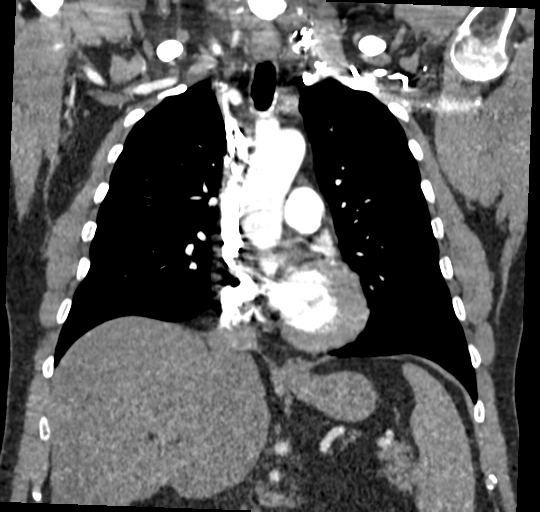

[Series 13: sagittal · sagittal · 0.71mm/px · 1 of 175 slices shown]
[im 88/175  soft-tissue]
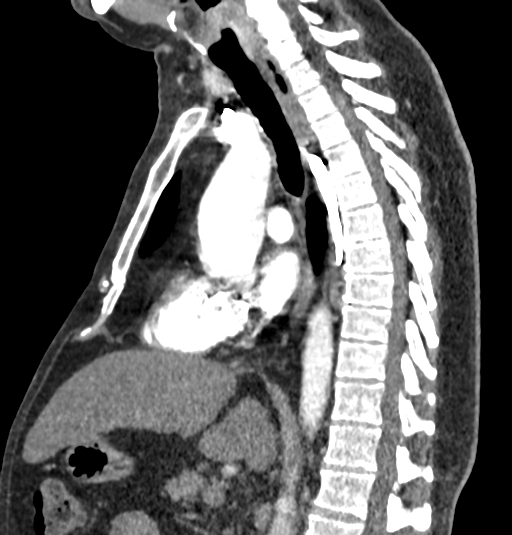

[5 of 16 positions shown; findings below may reference images not displayed]

FINDINGS: Pre-IV contrast images show mild atherosclerotic vascular calcification along the undersurface of the aortic arch. A small amount of aortic valve calcification is noted as well. Calcifications are seen within the lumen of the gallbladder. A punctate calcification is seen in the upper right renal collecting system. The liver shows decreased density.

Post IV contrast images show appropriate arterial phase enhancement. The heart size is normal. There is no pericardial effusion. The ascending aorta measures 4.5 cm cross-sectional diameter. The patient has a bovine arch variant with a common origin for the brachiocephalic artery and the left common carotid artery. The left vertebral artery comes off just past the takeoff of the left subclavian artery. The descending thoracic aorta has a normal course and caliber. There are no intimal defects identified.

The lungs show some large blebs both apices. Some peribronchial cuffing is present mostly in the perihilar regions. There is no lobar or segmental infiltrate or consolidation. There are no suspicious pulmonary nodules or masses. Medial left hemidiaphragm has a tiny defect through which a small amount of fat protrudes. There is no pleural effusion.

The mediastinal and hilar regions do not show any abnormally enlarged lymph nodes. There is no axillary or periclavicular adenopathy. The musculoskeletal system is unremarkable.
IMPRESSION: 1.
4.5 cm ascending aortic aneurysm.

2.
Cholelithiasis.

3.
Right nephrolithiasis.

4.
Fatty infiltration of liver.

5.
Perihilar peribronchial cuffing.

Stat fax

Total radiation dose to patient is CTDIvol 17.09 mGy and DLP 421.19 mGy-cm.

Dose reduction technique used: Automated exposure control and adjustment of the mA and/or kV according to patient size.  CT count in previous 12 months: 0

## 2021-03-20 IMAGING — MR MRI ABDOMEN WO/W CONTRAST
5 of 9 series · 17 of 48 positions shown · IV contrast (prohance)
Comparison: none

HISTORY: 60 year old Male with cirrhosis of liver.
TECHNIQUE: Multiplanar, multisequence MR images of the abdomen were acquired without and with intravenous contrast.

CONTRAST: 20 cc of ProHance.
COMPARISONS: Abdominal abdomen without and with contrast 09/06/2020

[Series 1: bSSFP · axial · 8.0mm · 1.76mm/px · 1 of 22 slices shown]
[im 1/22]
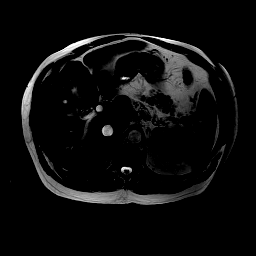

[Series 2: t2_cor_haste · coronal · 6.0mm · 0.73mm/px · 2 of 26 slices shown]
[im 1/26]
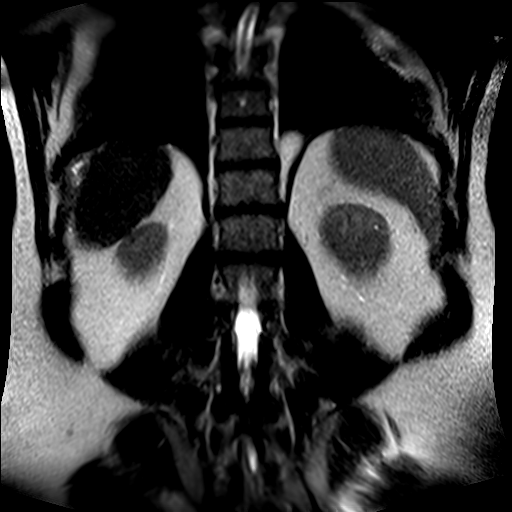
[im 26/26]
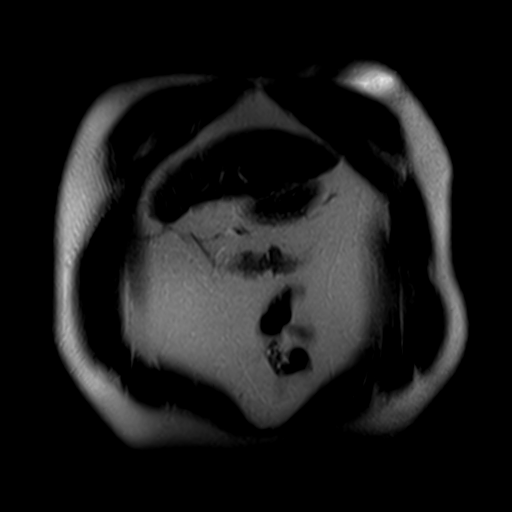

[Series 3: t2_axial_fs · axial · 6.0mm · 1.64mm/px · z∈[-72,+137]mm · 3 of 30 slices shown]
[im 1/30]
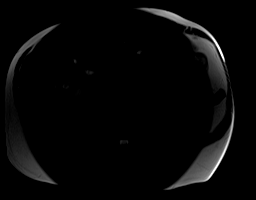
[im 15/30]
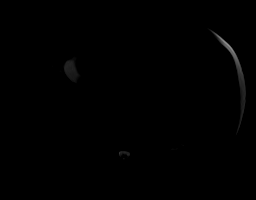
[im 30/30]
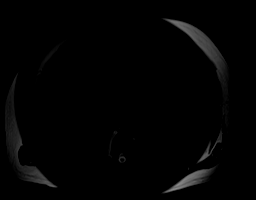

[Series 4: t1_vibe_(person_name)_axial_fs_in · axial · 3.0mm · 0.58mm/px · z∈[-55,+134]mm · 7 of 64 slices shown]
[im 1/64]
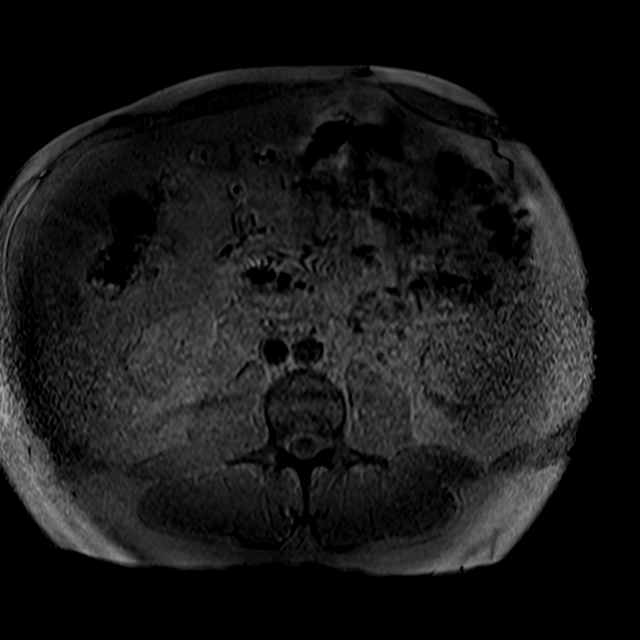
[im 11/64]
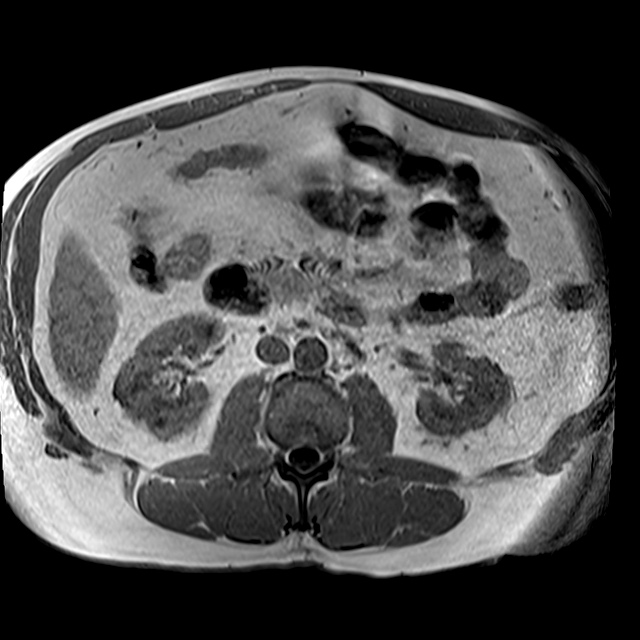
[im 22/64]
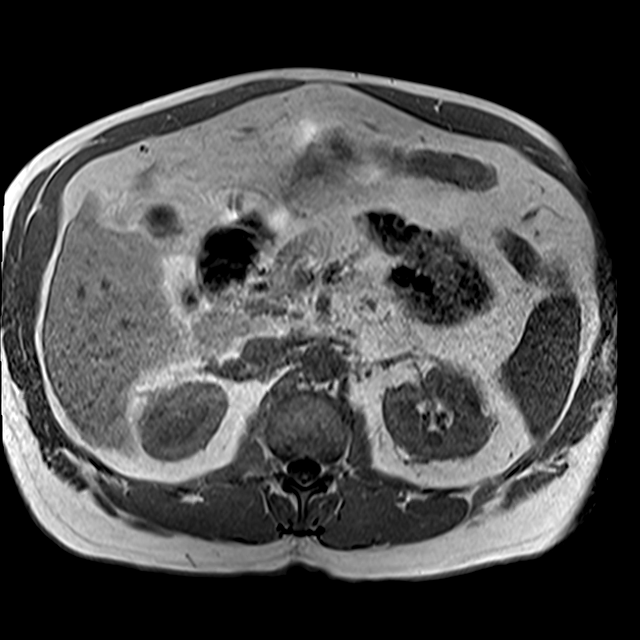
[im 32/64]
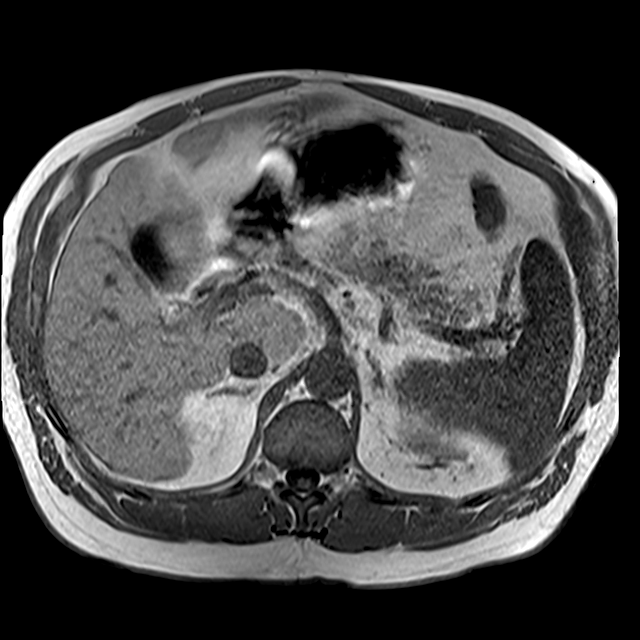
[im 43/64]
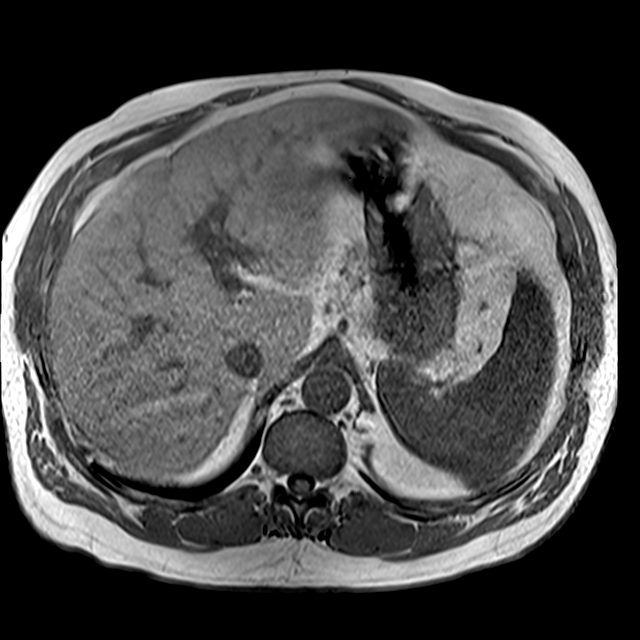
[im 53/64]
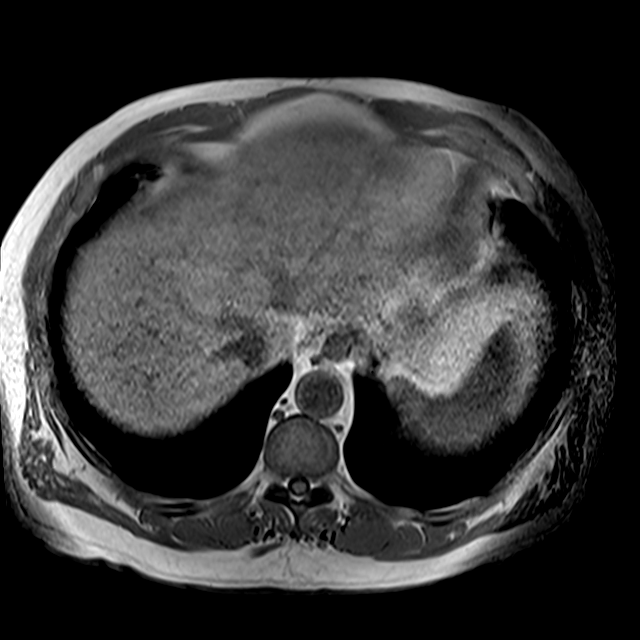
[im 64/64]
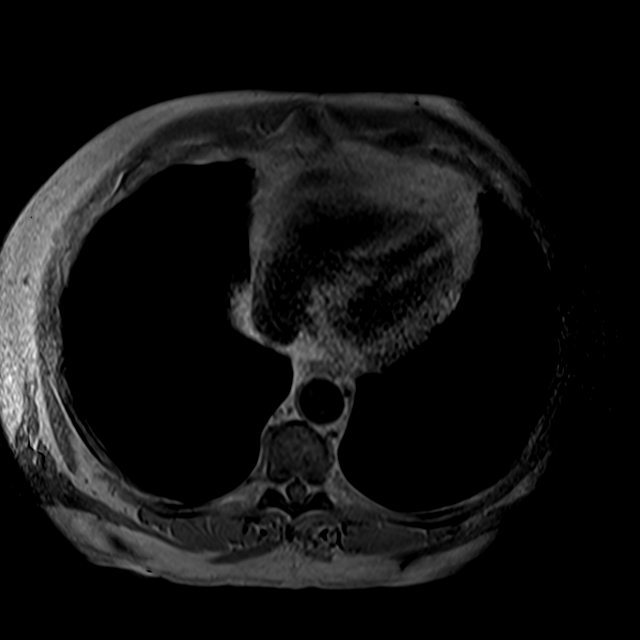

[Series 5: t1_vibe_(person_name)_axial_fs_opp · axial · 3.0mm · 0.58mm/px · z∈[-55,+38]mm · 4 of 64 slices shown]
[im 1/64]
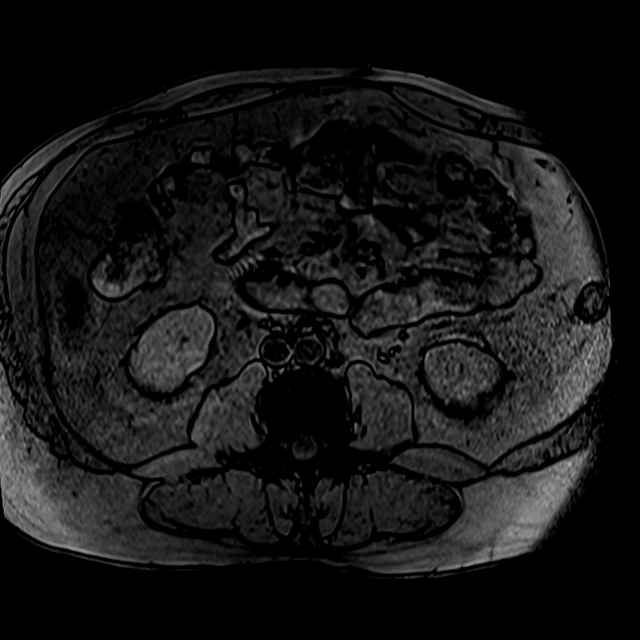
[im 11/64]
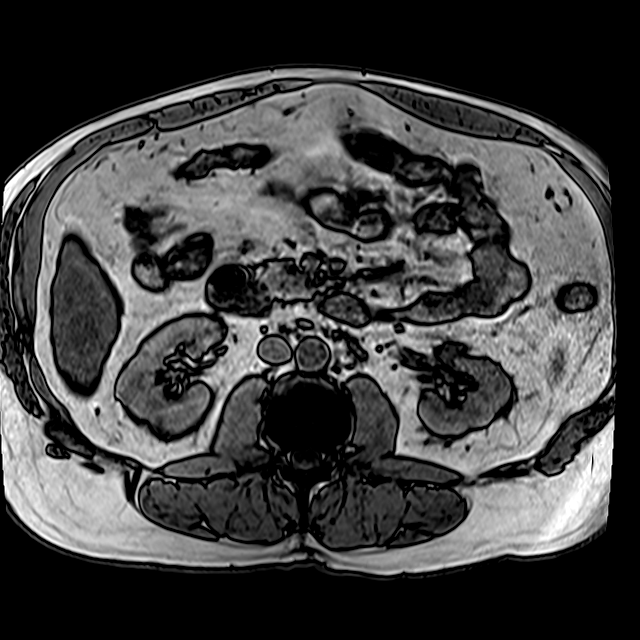
[im 22/64]
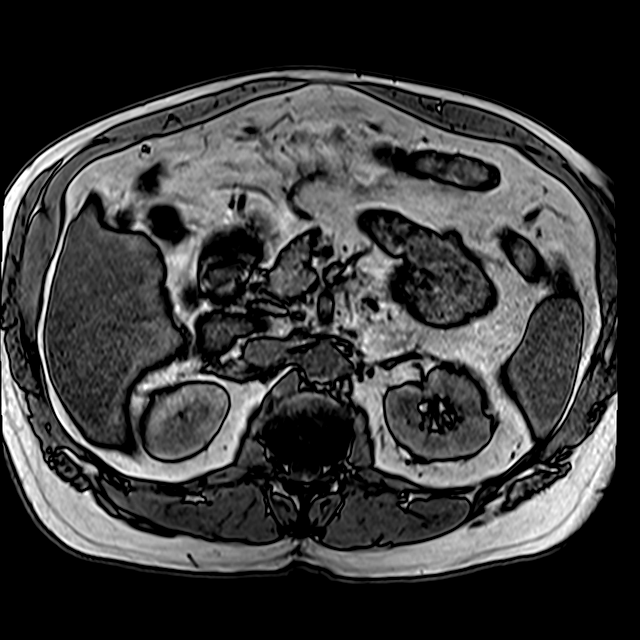
[im 32/64]
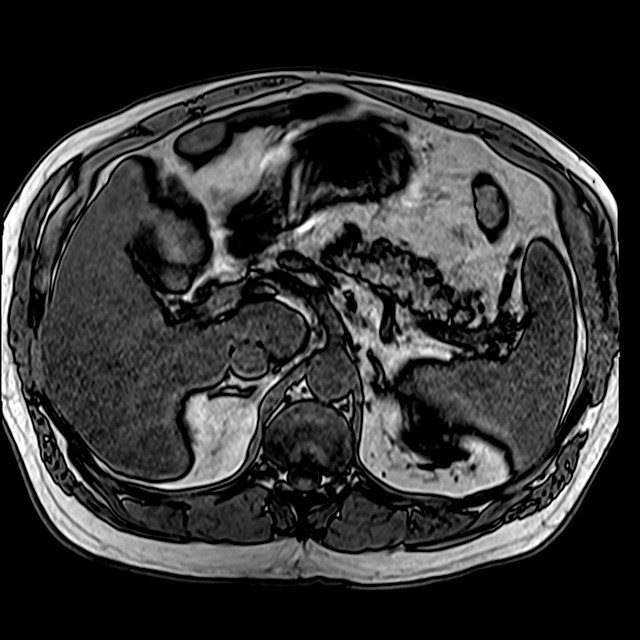

[17 of 48 positions shown; findings below may reference images not displayed]

FINDINGS: LOWER CHEST:  

Unremarkable.

ABDOMEN AND PELVIS:

LIVER: Loss of signal on out-of-phase images compared to in-phase images within the hepatic parenchyma suggests hepatosteatosis. Lobulated hepatic surface suggestive of cirrhosis

Unchanged size of right hepatic lobe segment V lesion (14 mm) is not significantly changed demonstrating non-rim arterial enhancement without washout and persistent fatty sparing. MUSTIKA 3.

Unchanged size of right hepatic lobe segment VII lesion (11 mm), which also demonstrates non-rim arterial enhancement without washout and persistent fatty sparing. MUSTIKA 3.

16 mm nodule is noted in segment IVb, more conspicuous on today's examination. This lesion demonstrates similar characteristics as above, non-rim arterial enhancement without washout and persistent fatty sparing. MUSTIKA 3.

No new lesions identified.

BILIARY TRACT: No intra- or extrahepatic biliary ductal dilation.

GALLBLADDER: Normal in size. No cholelithiasis.

PANCREAS: Normal.

SPLEEN: Normal.

ADRENALS: Normal.

KIDNEYS: Normal.

VISUALIZED STOMACH AND BOWEL: Unremarkable.

VESSELS: Unremarkable.

LYMPH NODES: No significantly enlarged abdominal lymph nodes.

PERITONEUM:   No ascites.

BONES AND SOFT TISSUES: 

Unremarkable.
IMPRESSION: Three MUSTIKA 3 hepatic lesions described above, stable. No new lesions.

## 2021-03-20 IMAGING — MR MRI GENERAL PELVIS WO/W CONTRAST
6 of 9 series · 23 of 48 positions shown · IV contrast (prohance)
Comparison: None.

HISTORY: Unspecified cirrhosis of liver
TECHNIQUE: Multiplanar, multisequence MRI of the pelvis without and with contrast was performed.

Contrast dose: 20 mL ProHance

[Series 2: bSSFP · axial · 8.0mm · 1.76mm/px · z∈[+67,+323]mm · 2 of 22 slices shown]
[im 1/22]
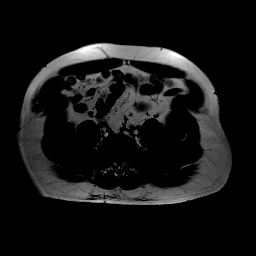
[im 22/22]
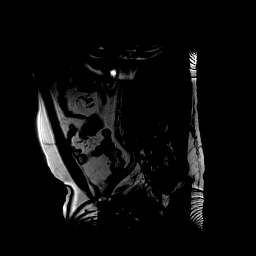

[Series 3: t2_cor_haste( incl kidneys) · coronal · 7.0mm · 0.78mm/px · 2 of 20 slices shown]
[im 1/20]
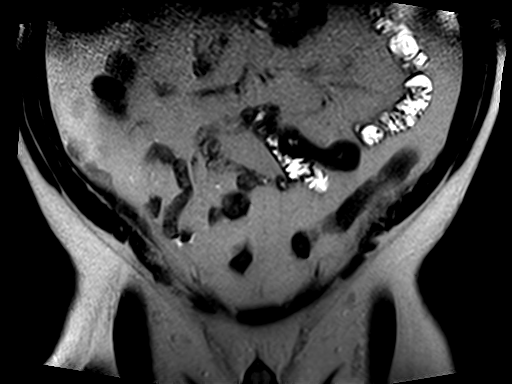
[im 20/20]
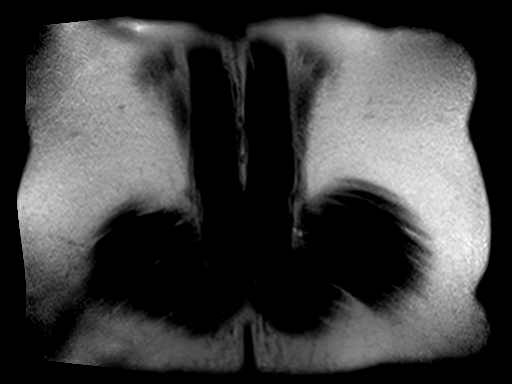

[Series 4: t2_sag · sagittal · 5.0mm · 0.64mm/px · 5 of 48 slices shown]
[im 1/48]
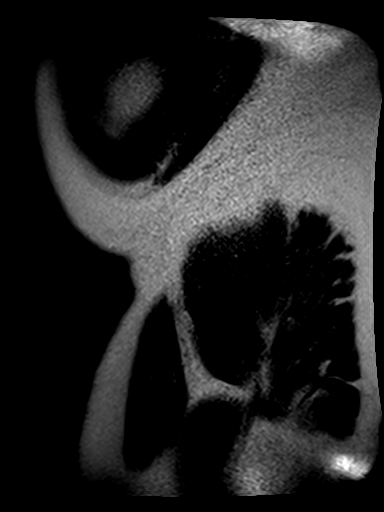
[im 12/48]
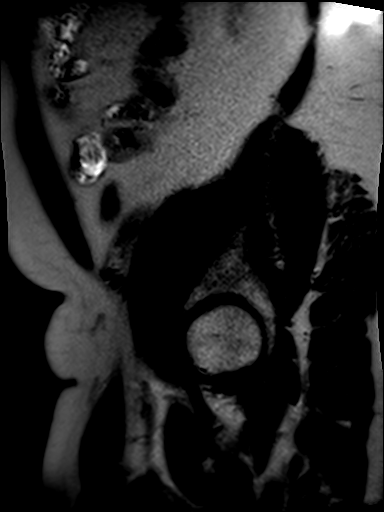
[im 24/48]
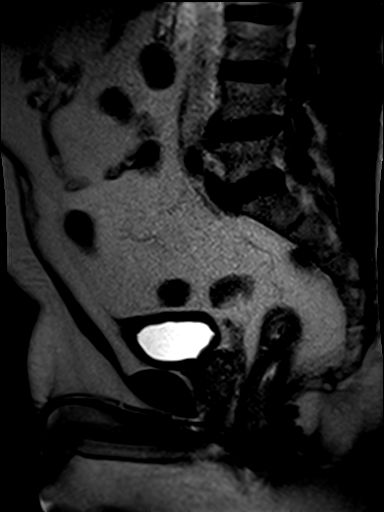
[im 36/48]
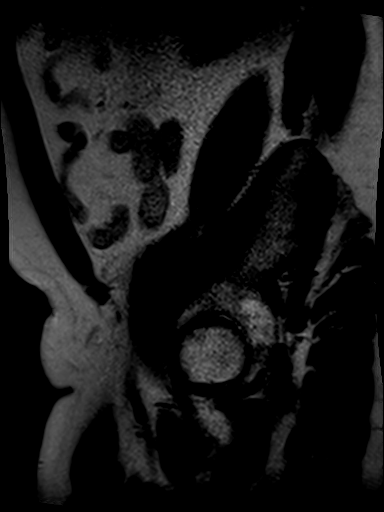
[im 48/48]
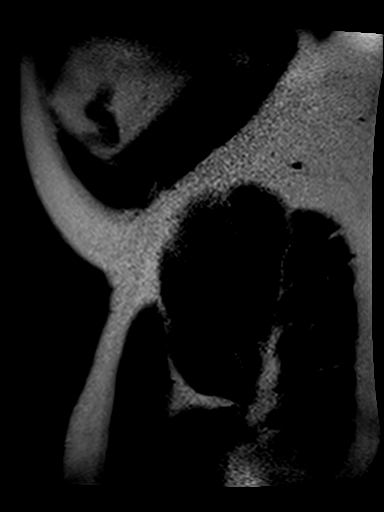

[Series 5: t2_axial · axial · 5.0mm · 1.37mm/px · z∈[-94,+110]mm · 4 of 35 slices shown]
[im 1/35]
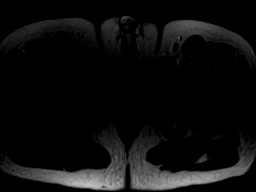
[im 12/35]
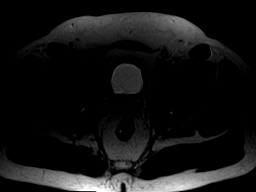
[im 23/35]
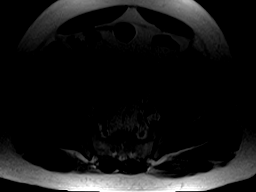
[im 35/35]
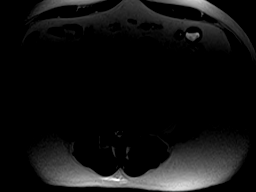

[Series 6: ir_cor · coronal · 6.0mm · 1.17mm/px · 3 of 32 slices shown]
[im 1/32]
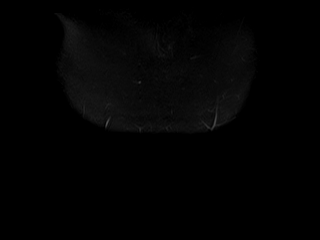
[im 16/32]
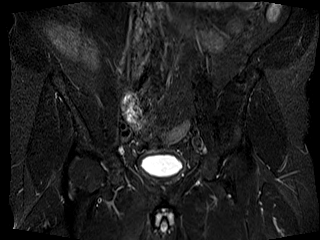
[im 32/32]
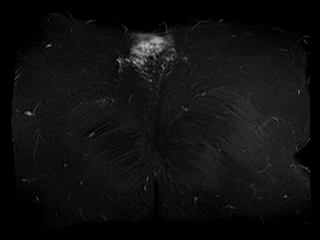

[Series 7: t1_vibe_(person_name)_axial_fs_in · axial · 3.0mm · 0.49mm/px · z∈[-100,+80]mm · 7 of 72 slices shown]
[im 1/72]
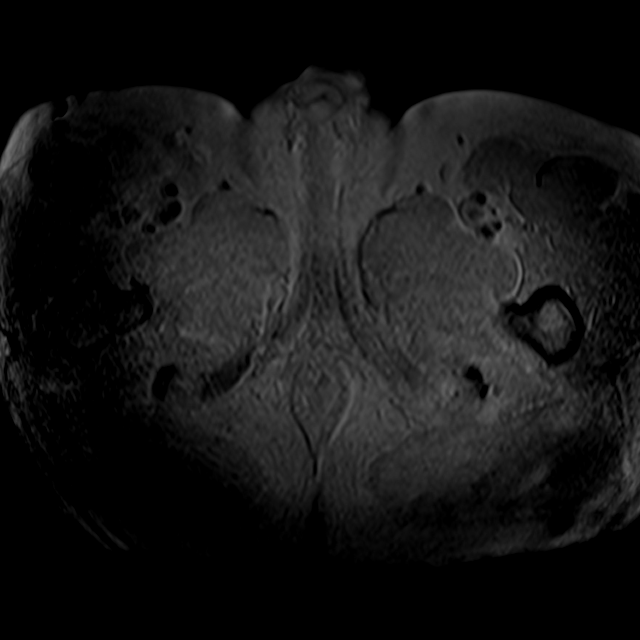
[im 11/72]
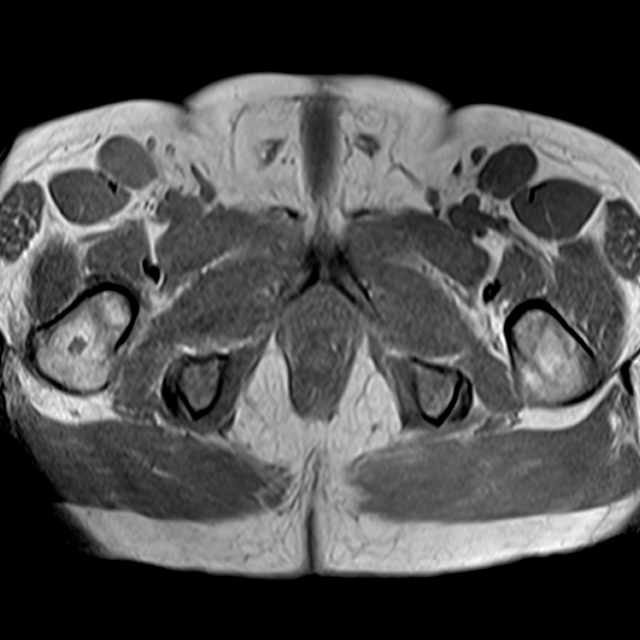
[im 21/72]
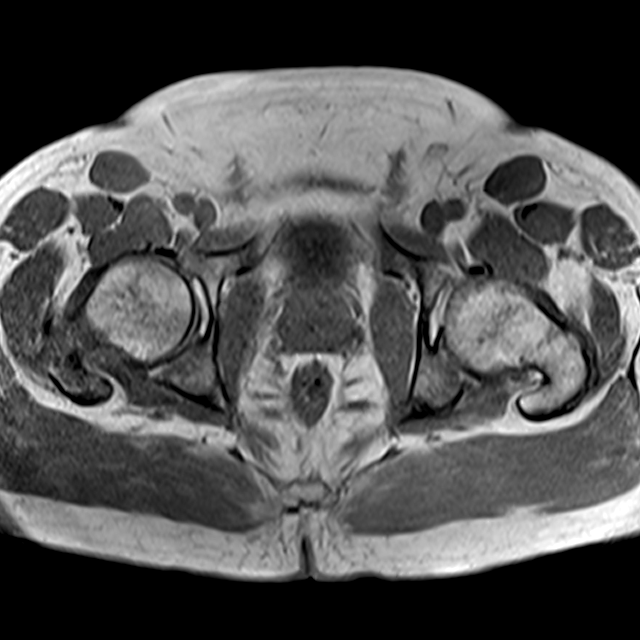
[im 31/72]
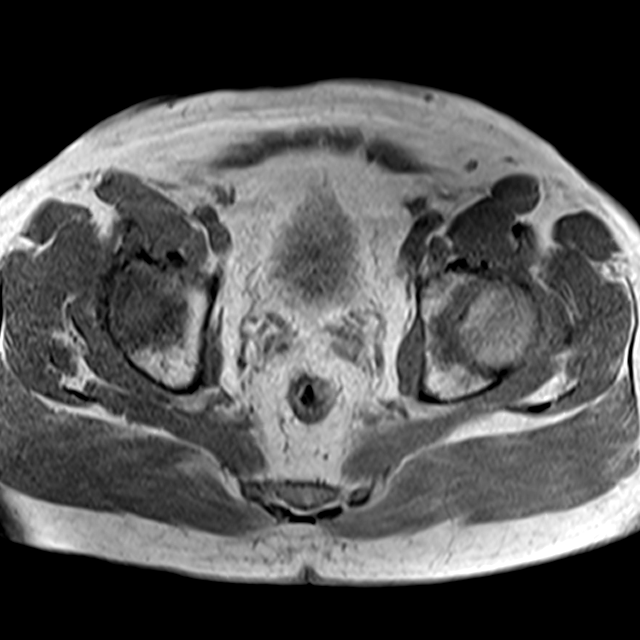
[im 41/72]
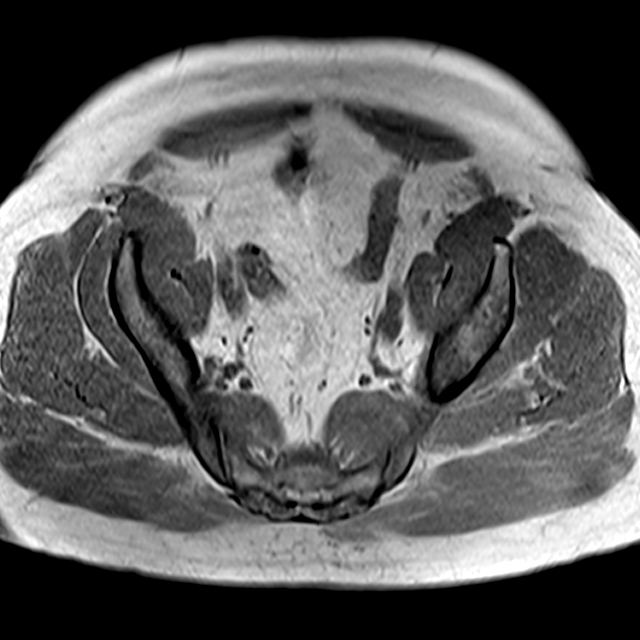
[im 51/72]
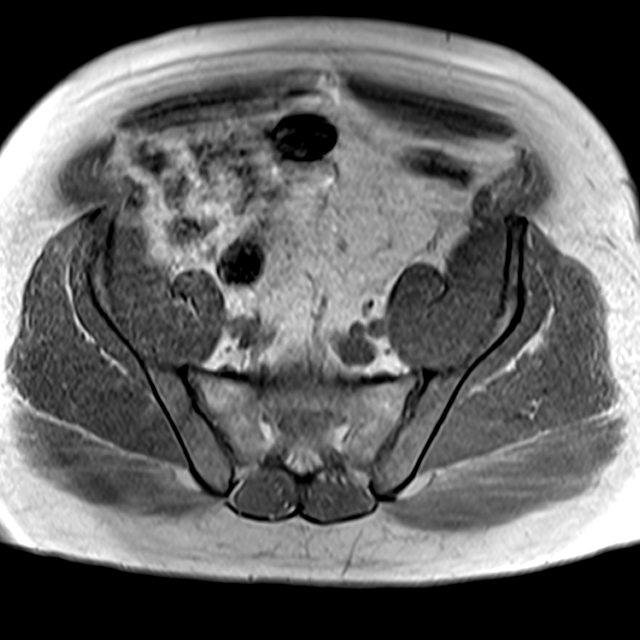
[im 61/72]
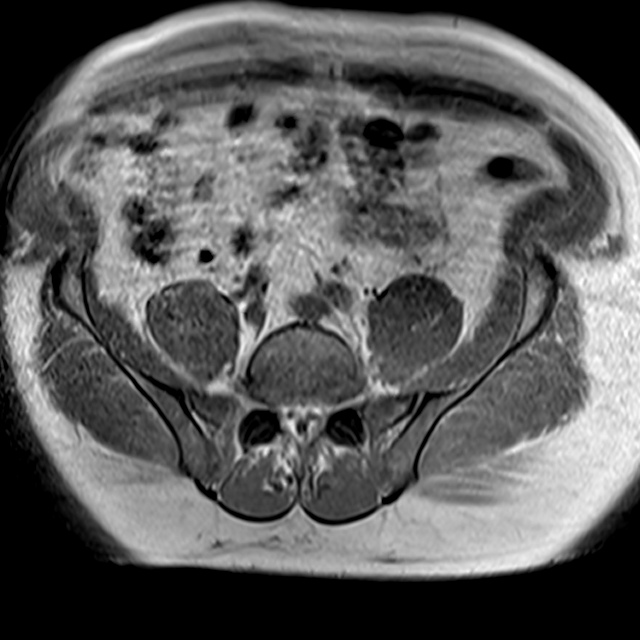

[23 of 48 positions shown; findings below may reference images not displayed]

FINDINGS: The visualized bowel loops have a normal caliber. There is smooth diffuse bladder wall thickening. Suggest correlation with urinalysis values to exclude infection.

There is asymmetry in the peripheral zone region of the prostate gland with decreased signal through the right peripheral zone. Suggest correlation with PSA levels and consideration for follow-up MRI prostate as clinically indicated.

No free fluid is seen. There are nonspecific bilateral external iliac nodes. No enlarged lymph nodes are seen. There is a T2 very bright circumscribed lesion in the intertrochanteric region of the right femur likely an enchondroma. There is some linear decreased T1 signal in the region of the femoral necks that is bilateral and symmetric perhaps related to prominent vessels or heterogeneous fatty marrow. There is no associated edema on T2-weighted sequences.
IMPRESSION: 1.
 Diffuse smooth bladder wall thickening. Suggest correlation with urinalysis for any evidence of infection.

2.
No ascites.

3.
Asymmetry in the peripheral zone of the prostate with decreased T2 signal to the right peripheral zone. Suggest correlation with PSA levels and consideration for follow-up MRI prostate exam as clinically indicated.

4.
Additional comments and incidental findings as above.

IMPORTANT FINDINGS!

Stat fax

## 2021-09-26 IMAGING — CT CTA CHEST WO-W CONTRAST
3 of 7 series · 5 of 16 positions shown, 6 images · IV contrast (agent unspecified)
Comparison: 09/29/20

Images Obtained from [facility]
HISTORY: 61-year-old Male with thoracic aortic aneurysm, without rupture, unspecified
Coronary Artery Calcification: Present
TECHNIQUE: Pre- and postcontrast helical CT acquisition from lung bases to apices with sagittal and coronal reformats. 2-D and 3-D reconstructed images performed. Post-processing software generated
rotating 3-D and MIP images obtained.
Dose reduction technique used: Automated exposure control and adjustment of the mA and/or kV according to patient size. CT Studies and Cardiac Nuclear Medicine Studies in last 12 months = 0
IV contrast: 100 mL Ssovue-2LP intravenous contrast.
Serum creatinine draw 0.9 mg/dL by i-STAT.
Total radiation dose to patient is CTDIvol 23.97 mGy and DLP 449.84 mGy-cm.

[Series 9: angio · axial · 0.68mm/px · z∈[-1326,-964]mm · 3 of 182 slices shown, 4 images]
[im 1/182  soft-tissue]
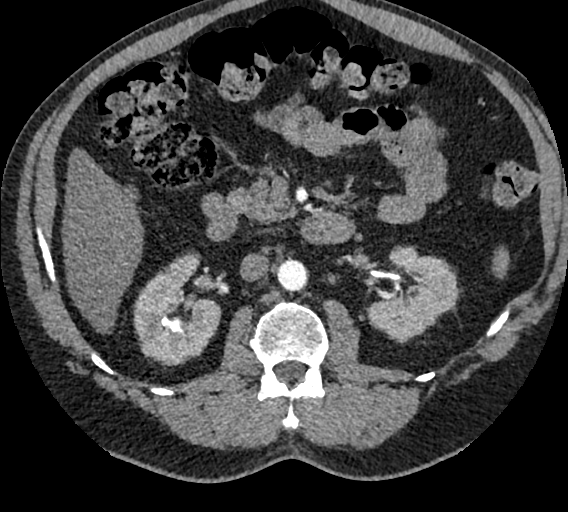
[im 1/182  bone]
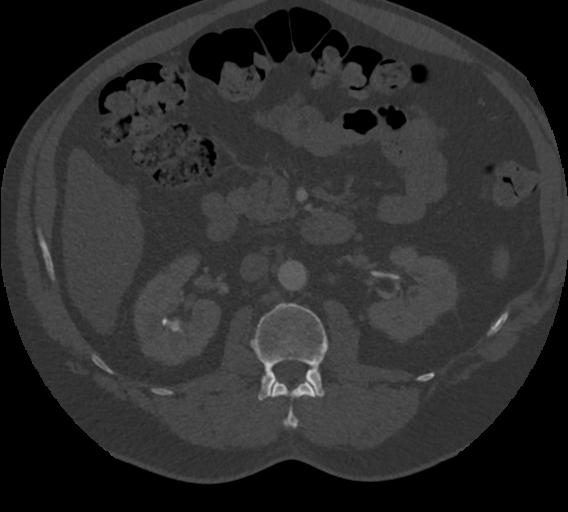
[im 91/182  soft-tissue]
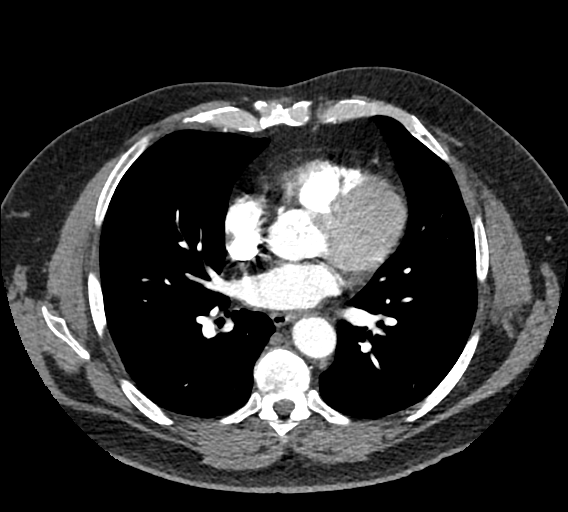
[im 182/182  soft-tissue]
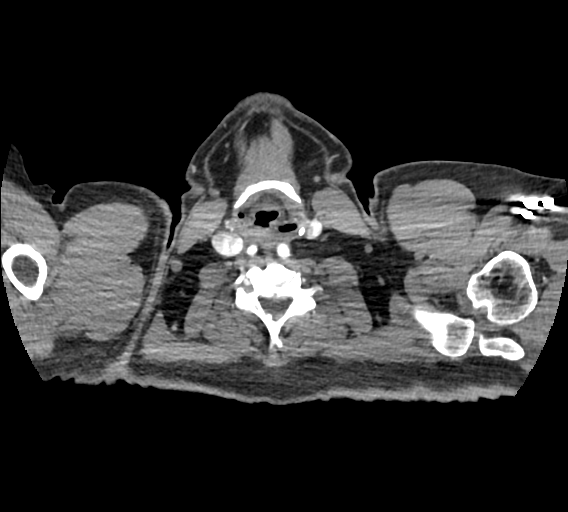

[Series 11: coronal · coronal · 0.76mm/px · 1 of 174 slices shown]
[im 87/174  soft-tissue]
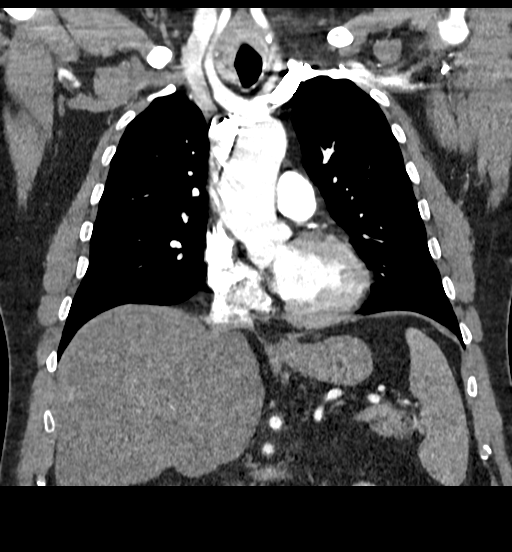

[Series 13: sagittal · sagittal · 0.68mm/px · 1 of 194 slices shown]
[im 65/194  soft-tissue]
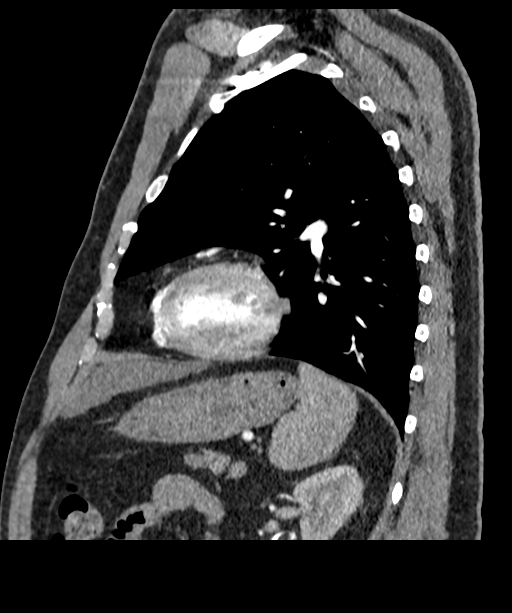

[5 of 16 positions shown; findings below may reference images not displayed]

FINDINGS: Pulmonary arteries: Technically adequate examination. The pulmonary arteries can be evaluated to the subsegmental level. No filling defects are seen. The main pulmonary artery is normal in caliber.
Airways: Patent.
Lungs: Tiny 2 x 3 mm nodule within the left major fissure looks unchanged from last year and highly likely benign given its location and appearance.
Pleura: No pleural effusion or pneumothorax.
Heart and Mediastinum: Normal heart size. No evidence of right heart strain. Mild coronary arterial calcifications. No pericardial effusion. The distal esophagus is unremarkable.
Thoracic Aorta: The ascending thoracic aneurysmal dilatation measures 47 mm greatest AP dimension. No dissection flap nor extravasation seen. The descending thoracic aorta remains normal in caliber.
Again noted is left vertebral arising directly off aorta just before the subclavian.
Lymph nodes: Normal in size.
Lower Neck: Unremarkable.
Chest Wall: Normal.
Upper Abdomen: There are bilateral renal high density areas most likely excreted gadolinium from an MRI abdomen earlier today.
In segment VIII of the liver, there is a slightly wedge-shaped 15 mm area of relative high density precontrast and postcontrast and looks similar to prior and I feel is "likely" focal fatty sparing
given the diffuse low-density of the liver.
Cholelithiasis.
Bones: Unremarkable.
IMPRESSION: Stable ascending thoracic fusiform aneurysm.

## 2021-09-26 IMAGING — MR MRI ABDOMEN WO/W CONTRAST
5 of 10 series · 17 of 48 positions shown · IV contrast (20cc prohance)
Comparison: none

[Series 2: bSSFP · axial · 8.0mm · 1.76mm/px · 1 of 22 slices shown]
[im 1/22]
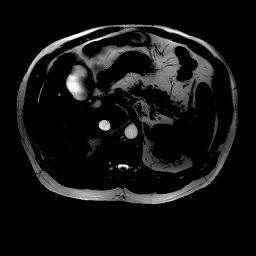

[Series 3: t2_cor_haste · coronal · 6.0mm · 0.78mm/px · 2 of 26 slices shown]
[im 1/26]
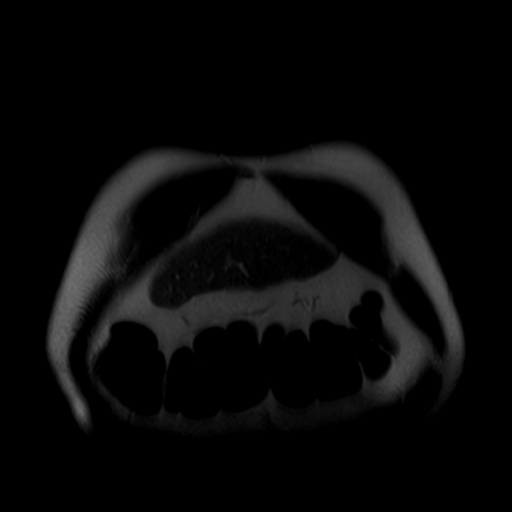
[im 26/26]
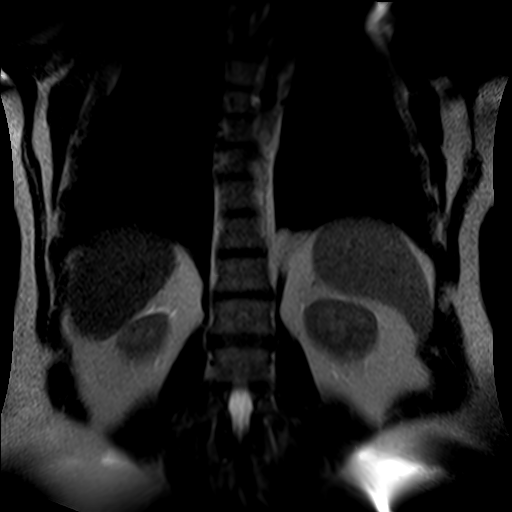

[Series 4: t2_axial_fs · axial · 6.0mm · 1.76mm/px · z∈[-141,+54]mm · 3 of 28 slices shown]
[im 1/28]
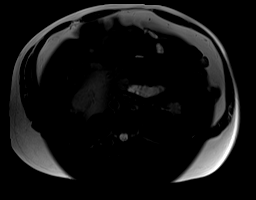
[im 14/28]
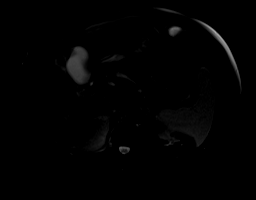
[im 28/28]
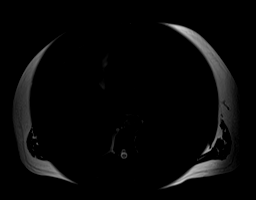

[Series 5: t1_vibe_(person_name)_axial_fs_in · axial · 3.0mm · 0.66mm/px · z∈[-137,+52]mm · 6 of 64 slices shown]
[im 1/64]
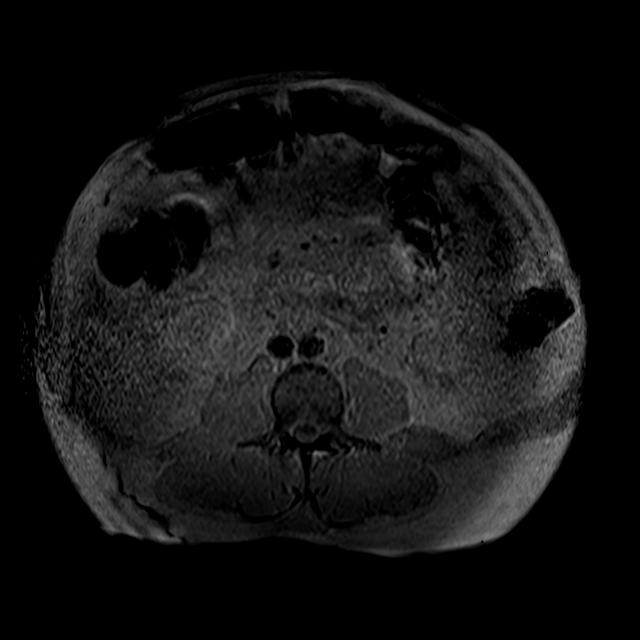
[im 13/64]
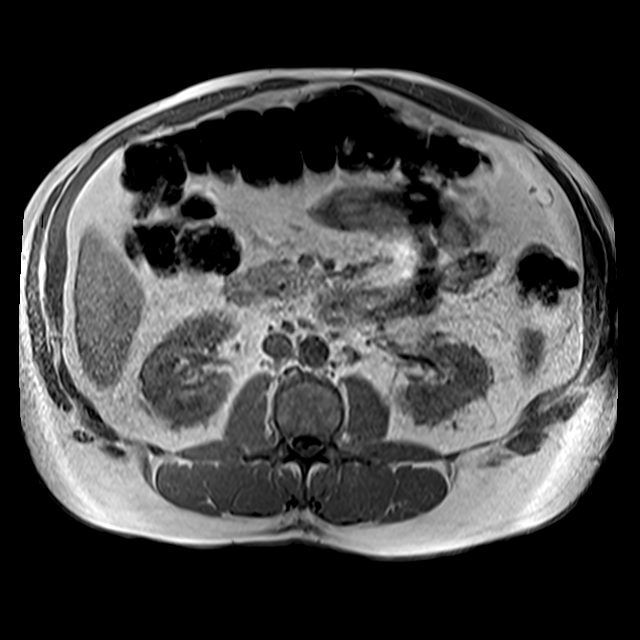
[im 26/64]
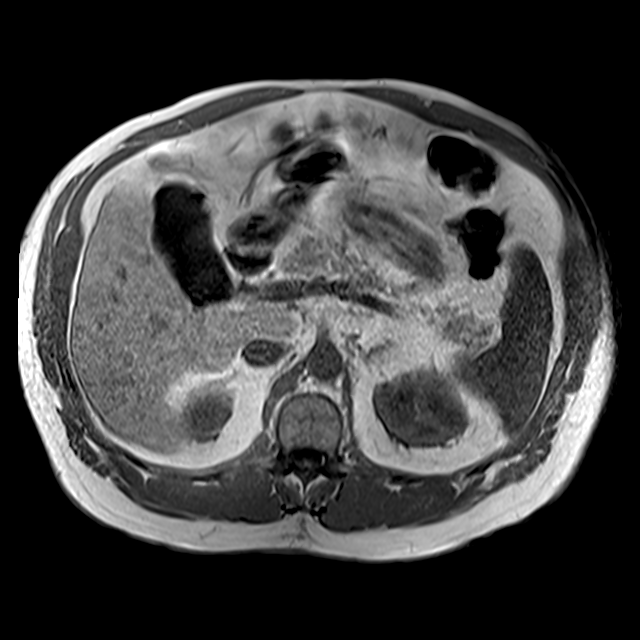
[im 38/64]
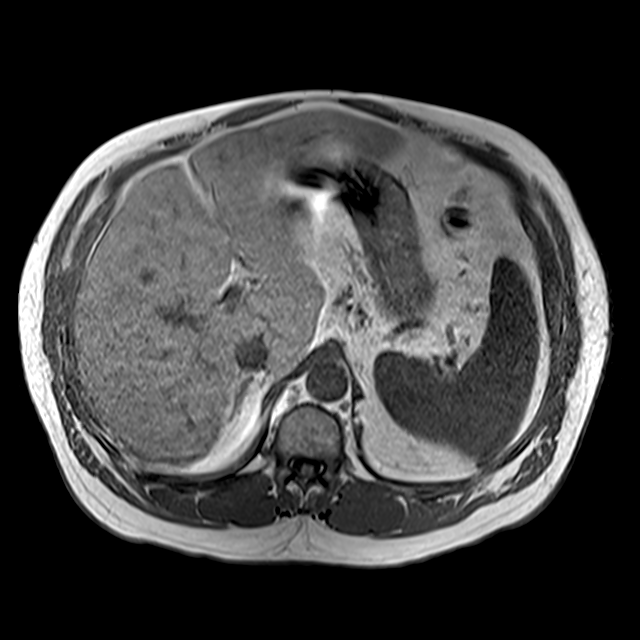
[im 51/64]
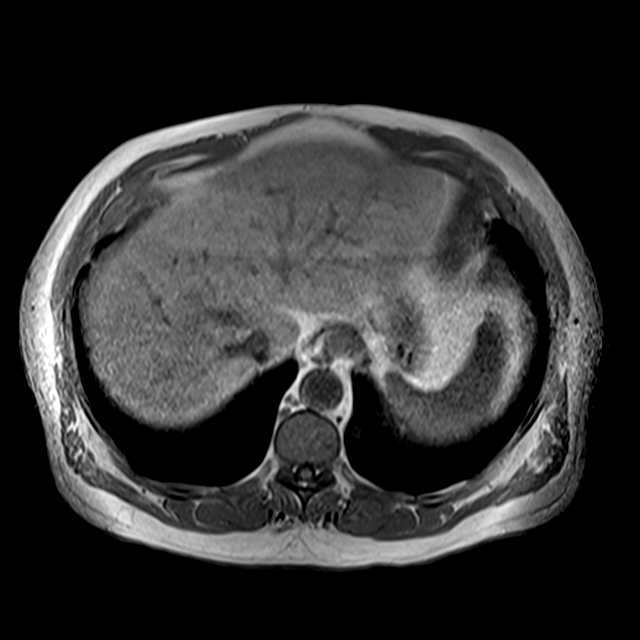
[im 64/64]
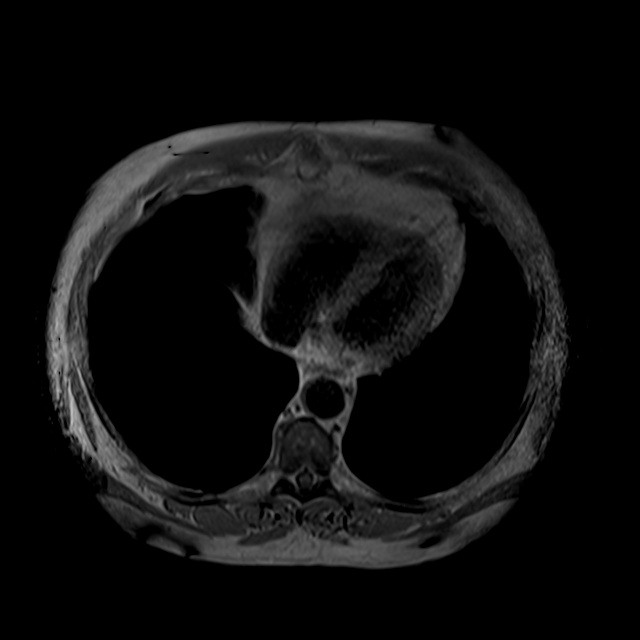

[Series 6: t1_vibe_(person_name)_axial_fs_opp · axial · 3.0mm · 0.66mm/px · z∈[-137,+13]mm · 5 of 64 slices shown]
[im 1/64]
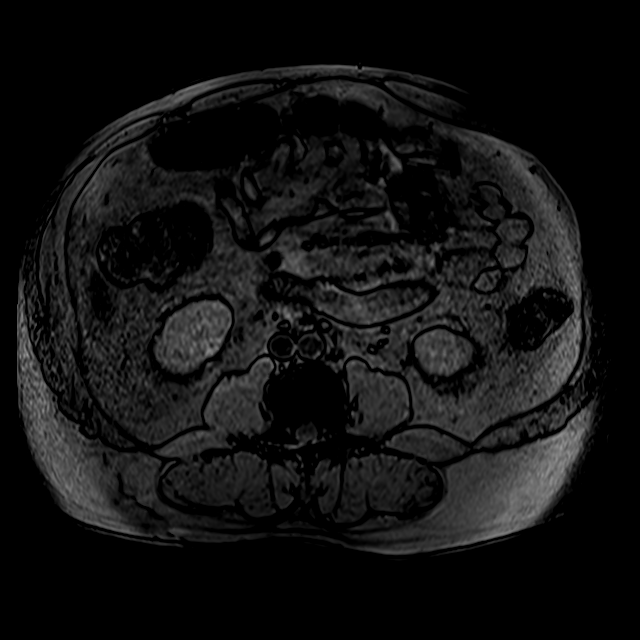
[im 13/64]
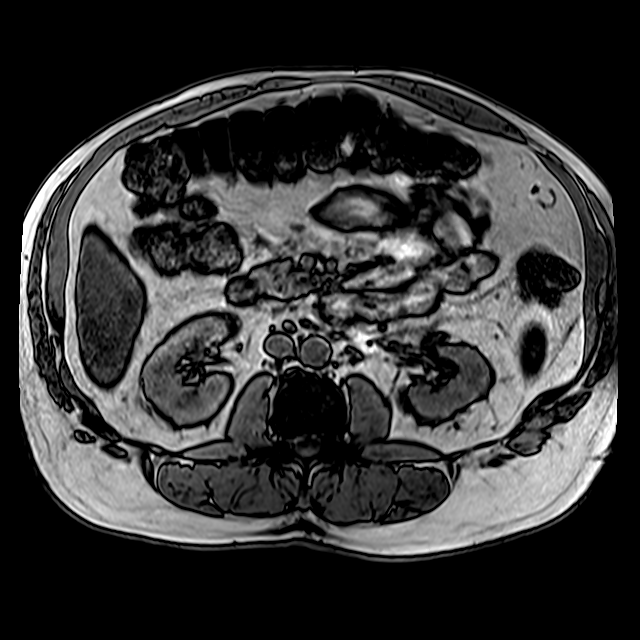
[im 26/64]
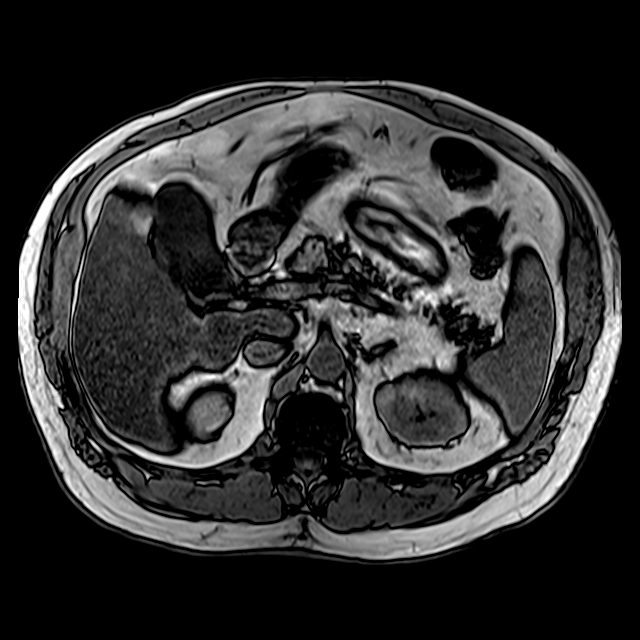
[im 38/64]
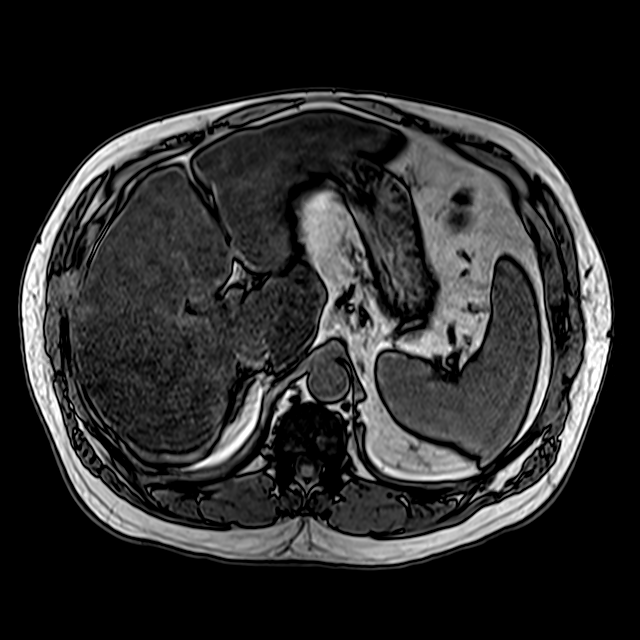
[im 51/64]
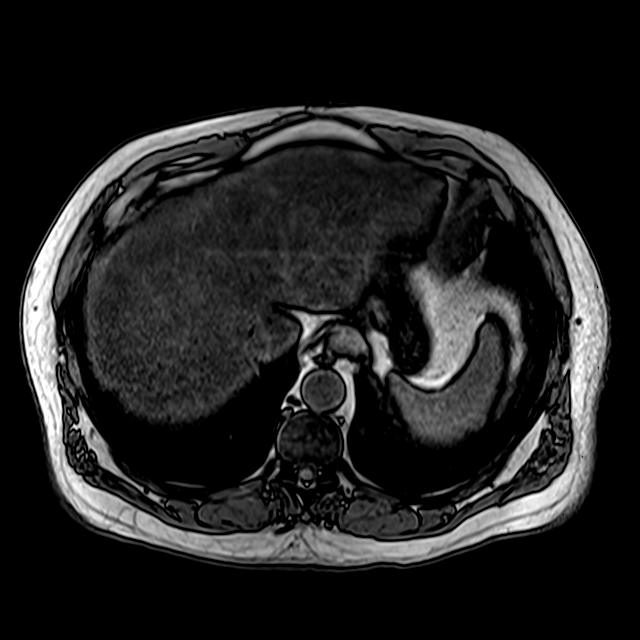

[17 of 48 positions shown; findings below may reference images not displayed]

Images Obtained from [facility]
REASON FOR EXAM
*  Unspecified cirrhosis of liver
*  Personal history of other infectious and parasitic diseases
*  Liver disease, unspecified
COMPARISON
*  MR abdomen 03/20/2021
*  MR abdomen 09/06/2020
TECHNIQUE
*  MR images of the abdomen were acquired before and after the administration of 20 mL ProHance IV contrast
FINDINGS
Hepatic Lesion #1
*  Segment VIII, subcapsular at the dome ([DATE])
*  Increasing size since 03/20/2021
*  [DATE] mm   --->   17 mm (subthreshold growth)
*  T2 mildly hyperintense
*  Focal fatty sparing on T1-weighted images
*  Positive for arterial hyperenhancement
*  Negative for washout
*  Negative for delayed enhancing capsule
*  LR-4
Hepatic Lesion #2
*  Segment V, subcapsular ([DATE])
*  Size is similar to the 03/20/2021 exam
*  15 mm   --->   16 mm
*  Focal fatty sparing
*  LR-3
Liver
*  Nodular
*  Steatosis with focal areas of sparing
*  Previously described (03/20/2021) segment IVb nodule is less conspicuous on today's exam--possibly focal fatty sparing at the porta hepatis
Vasculature
*  Standard hepatic arterial anatomy
*  Patent hepatic veins
*  Patent portal vein, splenic vein, SMV
Extrahepatic Manifestations of Cirrhosis
*  No significant collateral veins
*  No ascites
*  Spleen size at the upper limits of normal
Biliary and Pancreas
*  No biliary dilatation
Kidneys and Adrenals
*  Punctate renal cysts
Lymph Nodes
*  No suspicious lymph nodes
Additional
*  No malignant osseous enhancement
IMPRESSION
*  Hepatic segment VIII lesion displays subthreshold growth since 03/20/2021, probably hepatocellular carcinoma (LR-4)
*  Stable hepatic segment V lesion remains intermediate in probability of malignancy (LR-3)

## 2022-02-19 IMAGING — MR MRI ABDOMEN WO/W CONTRAST
12 series · 48 of 48 positions shown · non-contrast
Comparison: none

[Series 2: bSSFP · axial · 8.0mm · 1.76mm/px · 1 of 22 slices shown]
[im 1/22]
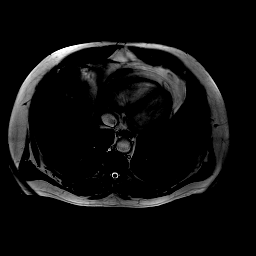

[Series 3: t2_cor_haste · coronal · 8.0mm · 0.84mm/px · 1 of 24 slices shown]
[im 1/24]
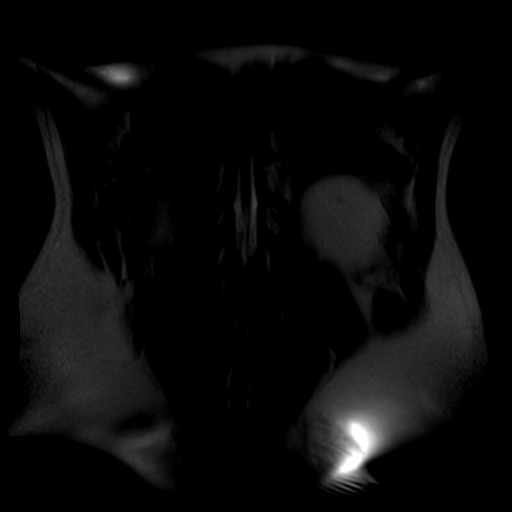

[Series 4: t2_axial_fs · axial · 8.0mm · 1.76mm/px · 1 of 26 slices shown]
[im 1/26]
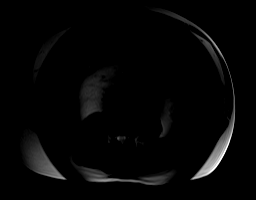

[Series 5: t1_vibe_(person_name)_axial_fs_in · axial · 3.5mm · 1.41mm/px · z∈[-155,+93]mm · 5 of 72 slices shown]
[im 1/72]
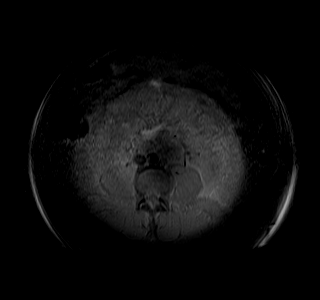
[im 18/72]
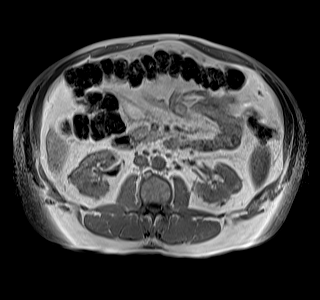
[im 36/72]
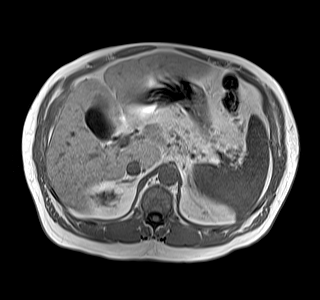
[im 54/72]
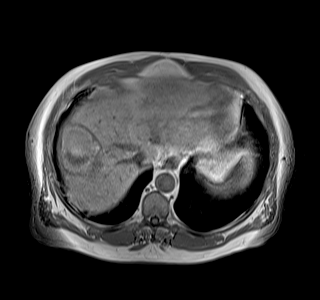
[im 72/72]
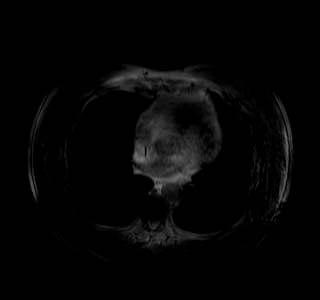

[Series 6: t1_vibe_(person_name)_axial_fs_opp · axial · 3.5mm · 1.41mm/px · z∈[-155,+93]mm · 5 of 72 slices shown]
[im 1/72]
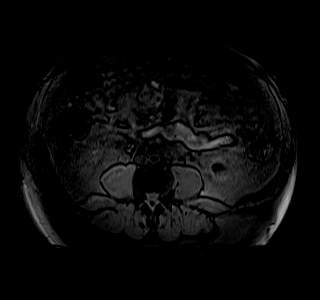
[im 18/72]
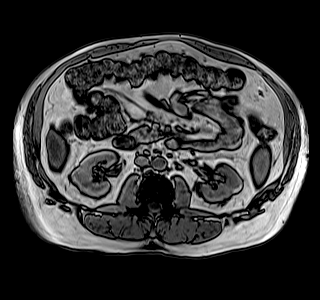
[im 36/72]
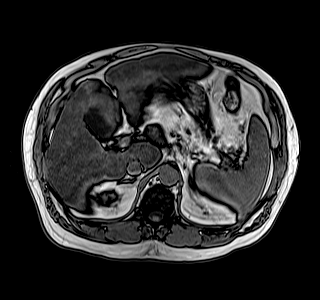
[im 54/72]
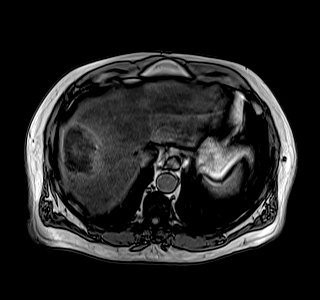
[im 72/72]
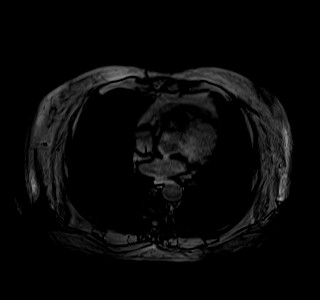

[Series 8: t1_vibe_(person_name)_axial_fs_w · axial · 3.5mm · 1.41mm/px · z∈[-155,+93]mm · 5 of 72 slices shown]
[im 1/72]
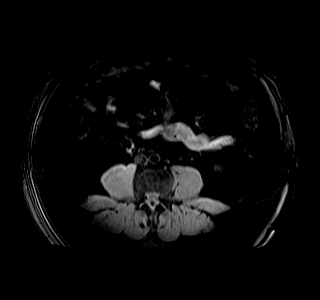
[im 18/72]
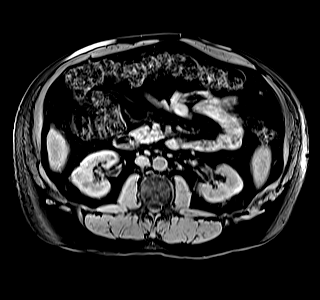
[im 36/72]
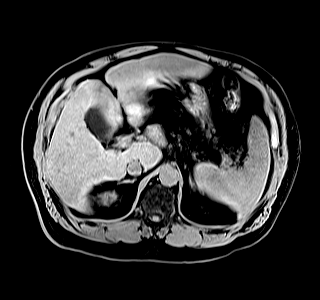
[im 54/72]
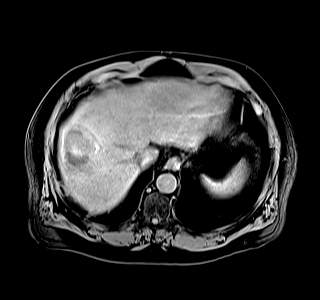
[im 72/72]
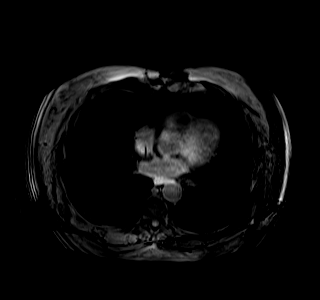

[Series 12: t1_vibe_(person_name)_axial_fs+c1_w · axial · 3.5mm · 1.41mm/px · z∈[-155,+93]mm · 5 of 72 slices shown]
[im 1/72]
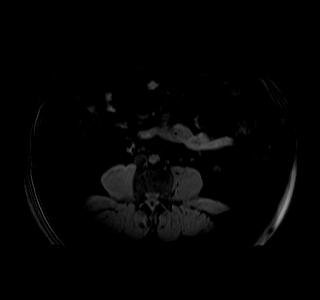
[im 18/72]
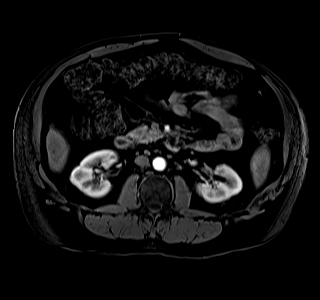
[im 36/72]
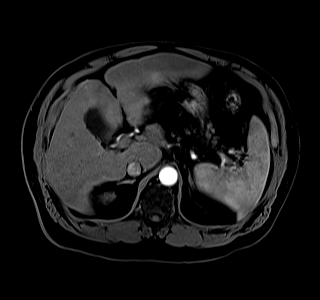
[im 54/72]
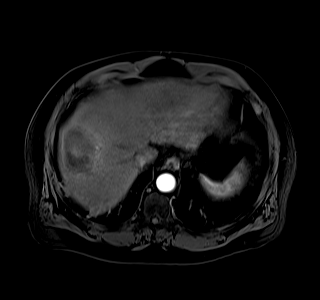
[im 72/72]
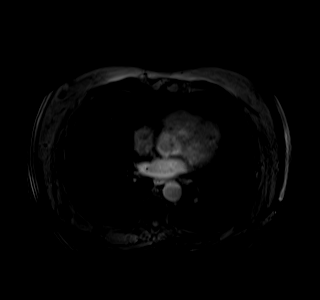

[Series 16: t1_vibe_(person_name)_axial_fs_+c2_w · axial · 3.5mm · 1.41mm/px · z∈[-155,+93]mm · 5 of 72 slices shown]
[im 1/72]
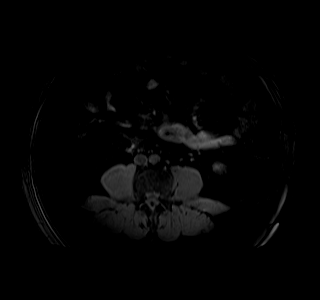
[im 18/72]
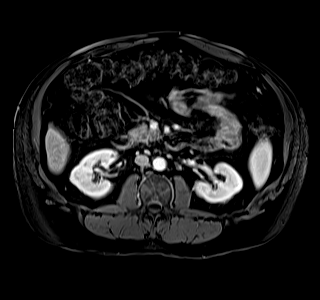
[im 36/72]
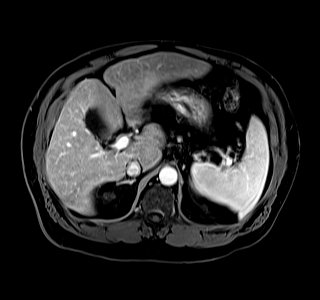
[im 54/72]
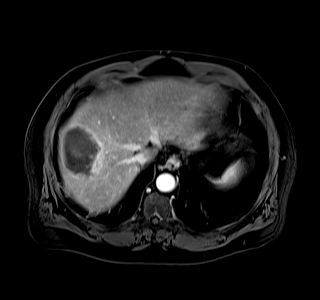
[im 72/72]
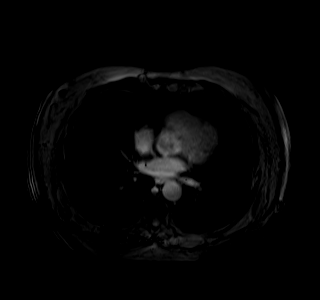

[Series 20: t1_vibe_(person_name)_axial_fs_+c3_w · axial · 3.5mm · 1.41mm/px · z∈[-155,+93]mm · 5 of 72 slices shown]
[im 1/72]
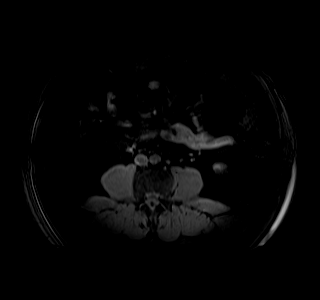
[im 18/72]
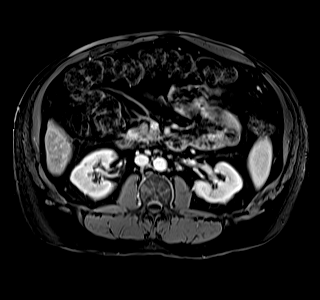
[im 36/72]
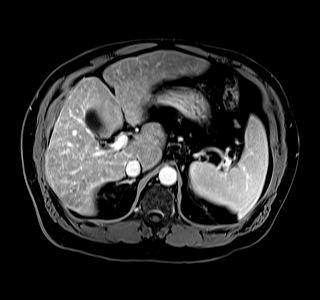
[im 54/72]
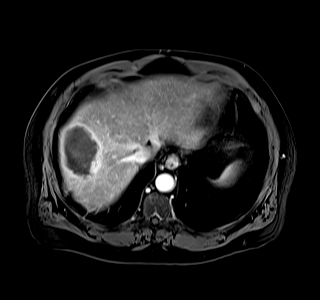
[im 72/72]
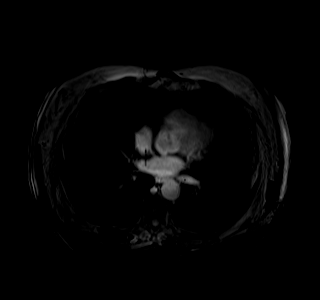

[Series 21: 20s_sub · axial · 3.5mm · 1.41mm/px · z∈[-155,+93]mm · 5 of 72 slices shown]
[im 1/72]
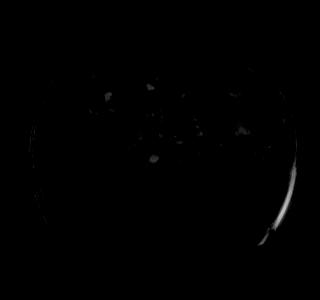
[im 18/72]
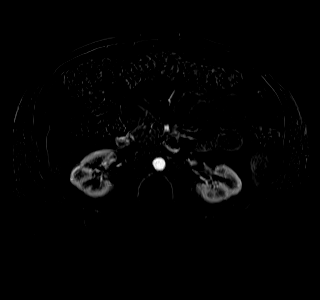
[im 36/72]
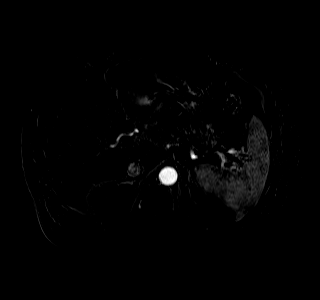
[im 54/72]
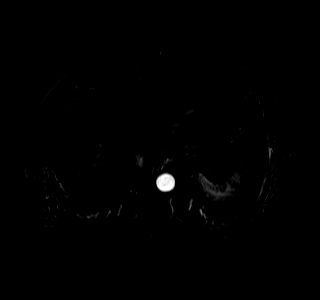
[im 72/72]
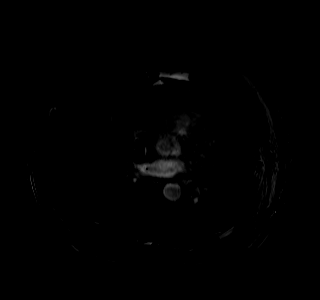

[Series 22: 70s_sub · axial · 3.5mm · 1.41mm/px · z∈[-155,+93]mm · 5 of 72 slices shown]
[im 1/72]
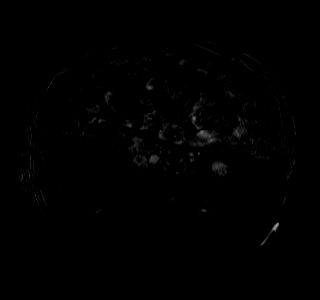
[im 18/72]
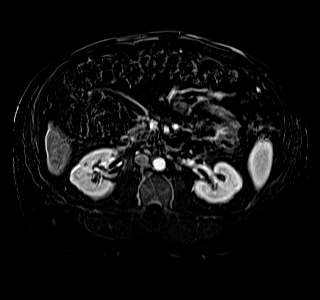
[im 36/72]
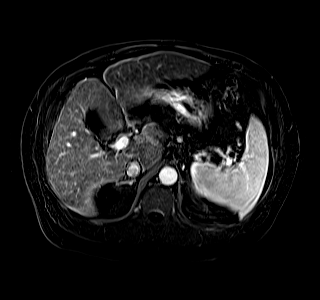
[im 54/72]
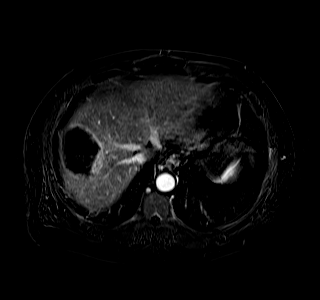
[im 72/72]
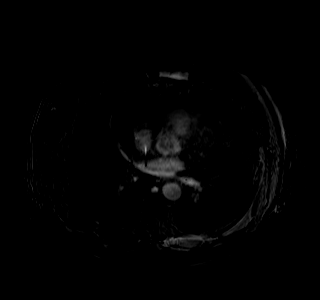

[Series 23: 3min_sub · axial · 3.5mm · 1.41mm/px · z∈[-155,+93]mm · 5 of 72 slices shown]
[im 1/72]
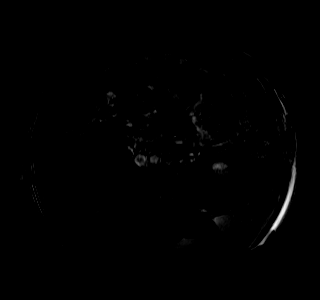
[im 18/72]
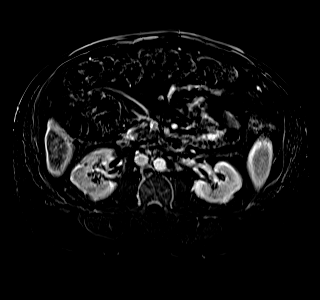
[im 36/72]
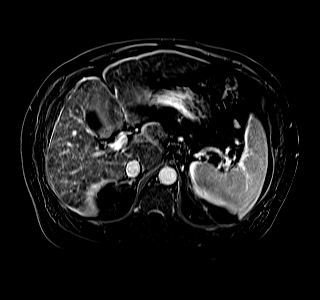
[im 54/72]
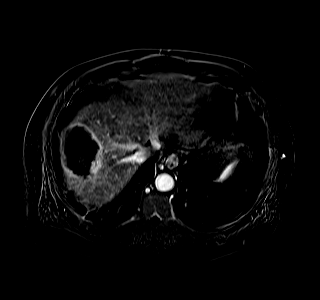
[im 72/72]
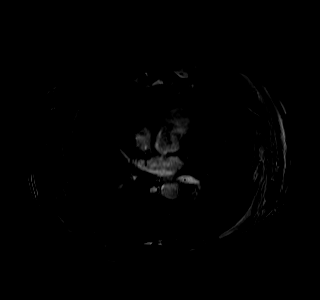

[48 of 48 positions shown; findings below may reference images not displayed]

Images Obtained from [facility]
REASON FOR EXAM
*  Hepatomegaly, not elsewhere classified
*  Unspecified cirrhosis of liver
COMPARISON
*  MR abdomen 09/26/2021
TECHNIQUE
*  MR images of the abdomen were acquired before and after the administration of 20 mL ProHance IV contrast
FINDINGS
Hepatic Lesion #1
*  Segment 8, subcapsular at the dome ([DATE])
*  At the superior margin of the ablation zone, but probably included within the ablation zone (on subtracted images)
*  Size is similar to the 09/26/2021 exam
*  17 mm   --->   16 mm
*  T1 hyperintense signal is more conspicuous on today's exam
*  Arterial hyperenhancement is unlikely
*  Negative for washout
*  LR-TR nonviable
Hepatic Lesion #2
*  Segment 5, subcapsular ([DATE])
*  Interval ablation
*  No nodular arterial hyperenhancement
*  Thin rim of delayed enhancement, typical of fibrosis
Liver
*  Nodular
*  Moderate steatosis
*  No new arterial hyperenhancement
Vasculature
*  Standard hepatic arterial anatomy
*  Patent hepatic veins
*  Patent portal vein, splenic vein, SMV
Extrahepatic Manifestations of Cirrhosis
*  A few tiny collateral veins
*  No ascites
*  Mildly enlarged spleen
Biliary and Pancreas
*  Cholelithiasis
Kidneys and Adrenals
*  Small renal cysts
*  Mild renal cortical scarring
Lymph Nodes
*  No suspicious lymph nodes
Additional
*  No malignant osseous enhancement
IMPRESSION
*  Negative for residual tumor at the hepatic segment 5 and 8 ablation zones (LR-TR nonviable). Continued surveillance is recommended

## 2022-02-26 IMAGING — CT CT ABD/PEL WO (STONE PROTOCOL)
2 of 4 series · 15 of 46 positions shown, 17 images · non-contrast
Comparison: Previous MRI abdomen February 19, 2022 and MRI pelvis March 20, 2021

Images Obtained from [facility]
HISTORY: - Calculus of kidney with calculus of ureter,
TECHNIQUE: CT renal stone protocol was performed. Axial CT images were obtained through abdomen and pelvis without administration of IV contrast.  Coronal and sagittal reformatted images were
generated from the original data set. Dose reduction technique used: Automated exposure control and adjustment of the mA and/or kV according to patient size. CT Studies and Cardiac Nuclear Medicine
Studies in last 12-months = 0

[Series 2: soft tissue · axial · 0.78mm/px · z∈[-1584,-1224]mm · 12 of 140 slices shown, 14 images]
[im 10/140  soft-tissue]
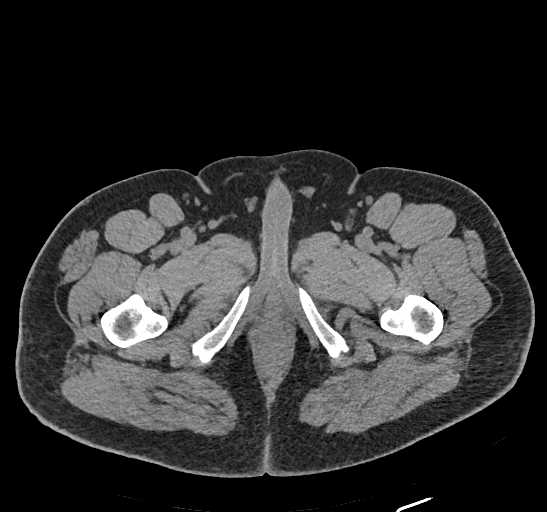
[im 10/140  bone]
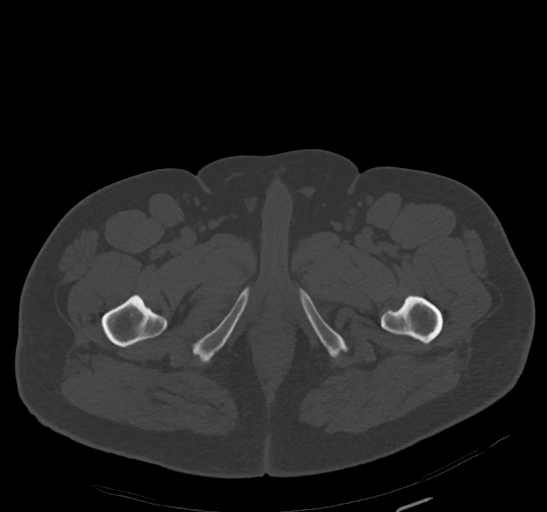
[im 20/140  soft-tissue]
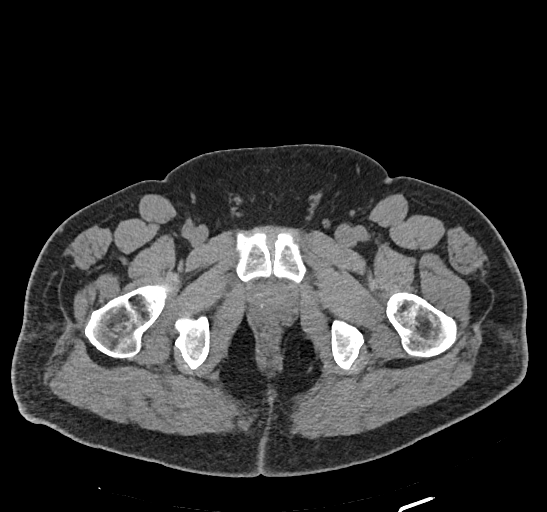
[im 29/140  soft-tissue]
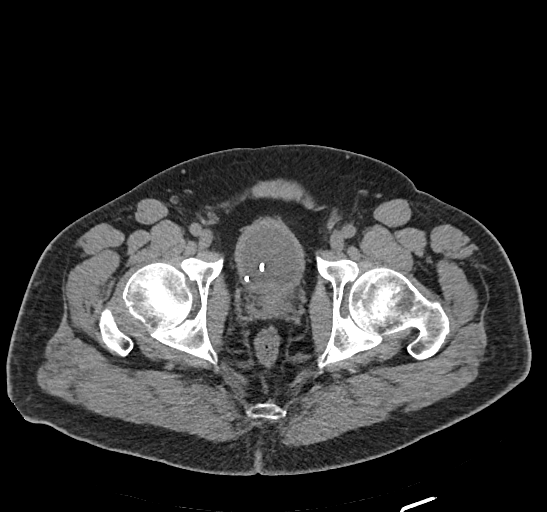
[im 44/140  soft-tissue]
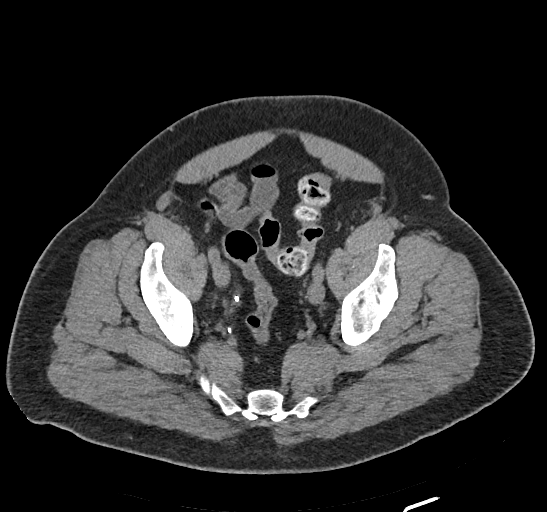
[im 53/140  soft-tissue]
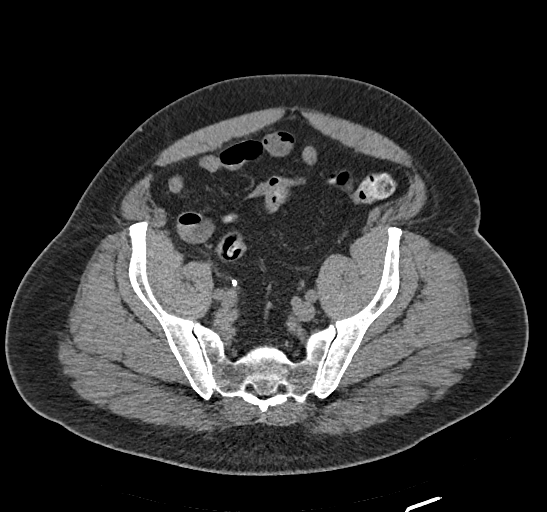
[im 63/140  soft-tissue]
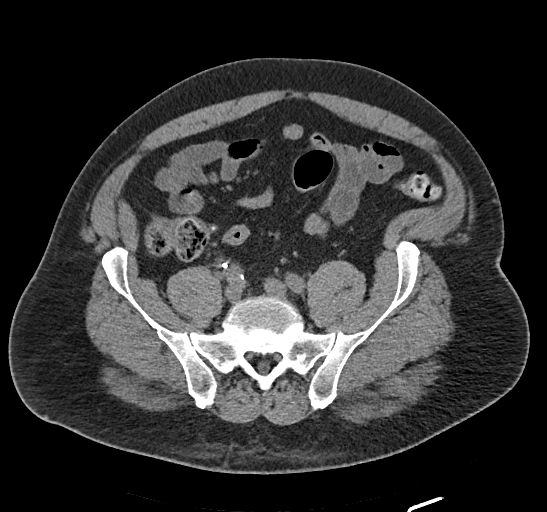
[im 77/140  soft-tissue]
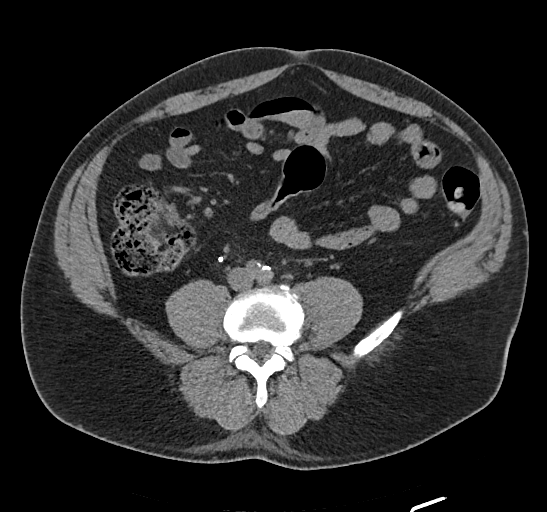
[im 87/140  soft-tissue]
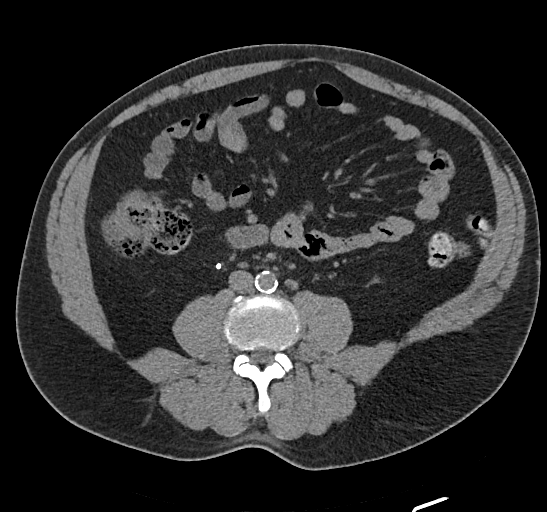
[im 96/140  soft-tissue]
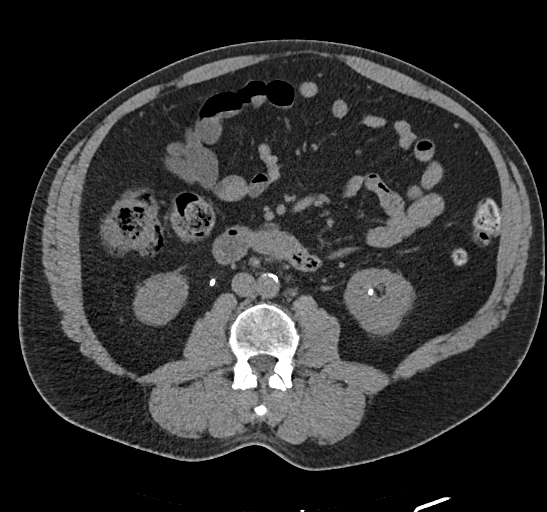
[im 96/140  bone]
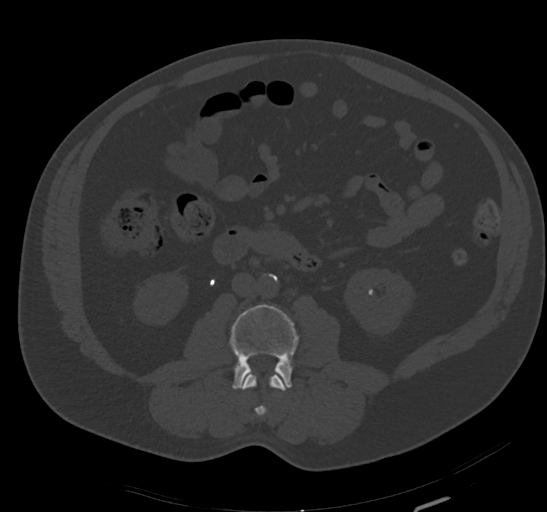
[im 111/140  soft-tissue]
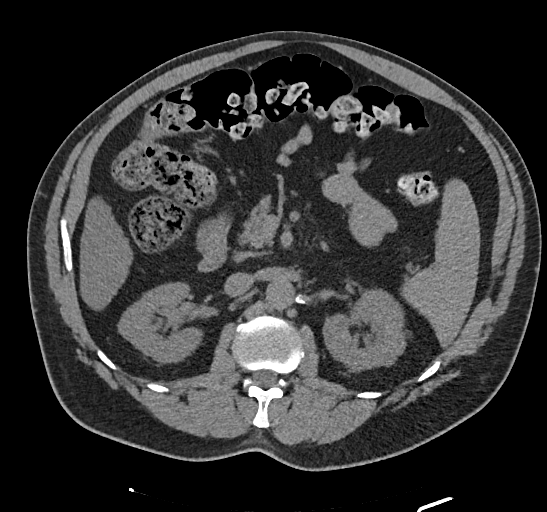
[im 120/140  soft-tissue]
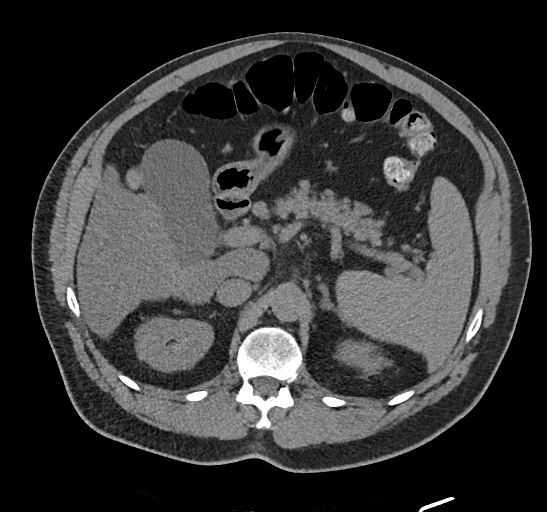
[im 130/140  soft-tissue]
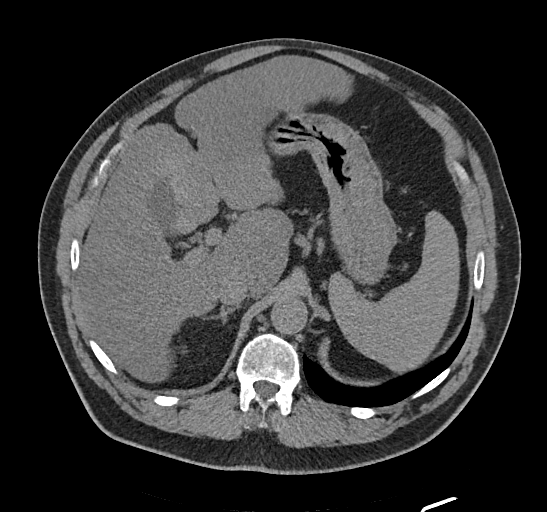

[Series 6: coronal · coronal · 0.82mm/px · 3 of 118 slices shown]
[im 40/118  soft-tissue]
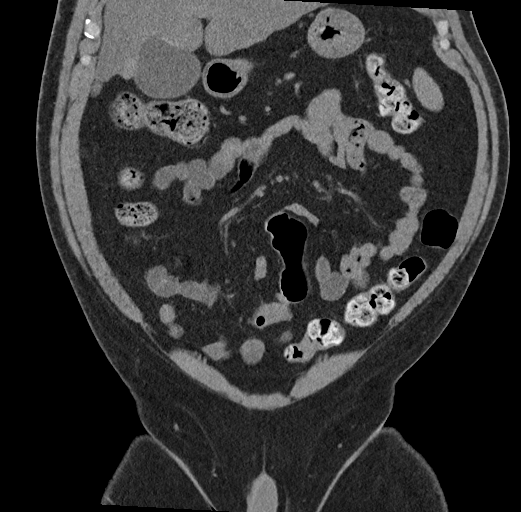
[im 53/118  soft-tissue]
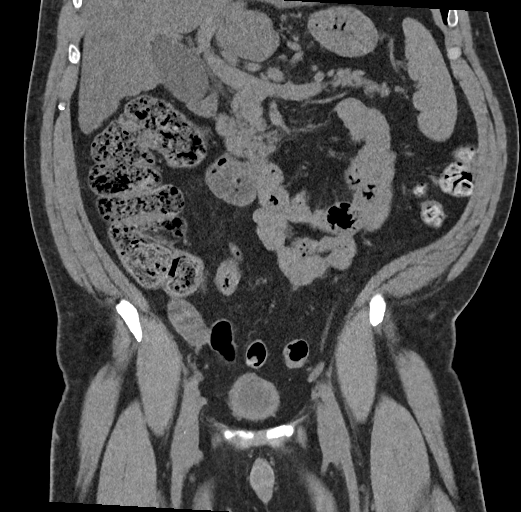
[im 66/118  soft-tissue]
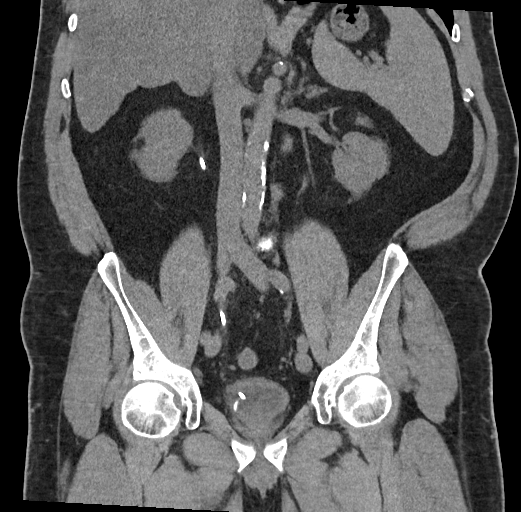

[15 of 46 positions shown; findings below may reference images not displayed]

FINDINGS: LUNGS BASES: Normal.
HEPATOBILIARY: The liver has a lobulated surface contour consistent with cirrhosis. There is concomitant hepatic steatosis with decreased density to the liver. There is a heterogeneous mixed density
lesion in the anterior segment of the right hepatic lobe measuring approximately 5.4 cm (([DATE]) corresponding to a previous ablation zone. MRI examination February 19, 2022 showed no findings to
indicate recurrent disease at that time. There are calcified stones in the gallbladder.
SPLEEN: The spleen is mildly enlarged measuring 14.5 cm in length.
PANCREAS: Normal.
ADRENAL GLANDS: Normal.
KIDNEYS: There are multiple bilateral intrarenal calculi. There is an indwelling right double-J ureter stent with the proximal loop in the right renal pelvis and distal loop in the bladder. There is
no hydronephrosis. No calcifications are seen along the course of the right ureter stent.
RETROPERITONEUM: There are some nonspecific para-aortic nodes. Moderate calcified plaque is seen about a normal caliber abdominal aorta.
BOWEL: Bowel loops have a normal caliber. Appendix is seen and appears normal.
PELVIS: There is the distal aspect of the right ureter stent in the bladder. Bladder is otherwise unremarkable. The prostate and seminal vesicles are unremarkable. No free fluid or adenopathy is seen.
OSSEOUS STRUCTURES: Moderate degenerative disc type changes are seen at L4-L5.
OTHER: There is a tiny fat-containing umbilical hernia. No ascites is seen.
IMPRESSION: 1.  Bilateral intrarenal calculi with a right-sided ureter stent. No hydronephrosis is seen.
2.  Cirrhosis and concomitant hepatic steatosis with an ablation zone in the right hepatic lobe.
3.  Mild splenomegaly likely indicating portal hypertension.
4.  Cholelithiasis.
5.  Additional comments and findings as above.
Total radiation dose to patient is CTDIvol 12.20 mGy and DLP 550.00 mGy-cm.
